# Patient Record
Sex: Male | Born: 1980
Health system: Southern US, Community
[De-identification: ages and names within clinical notes are randomized; demographics above are authoritative.]

## PROBLEM LIST (undated history)

## (undated) DIAGNOSIS — G44009 Cluster headache syndrome, unspecified, not intractable: Secondary | ICD-10-CM

## (undated) DIAGNOSIS — F17200 Nicotine dependence, unspecified, uncomplicated: Secondary | ICD-10-CM

## (undated) DIAGNOSIS — I10 Essential (primary) hypertension: Secondary | ICD-10-CM

## (undated) HISTORY — DX: Nicotine dependence, unspecified, uncomplicated: F17.200

## (undated) HISTORY — PX: SPINE SURGERY: SHX786

---
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
## 2000-11-24 ENCOUNTER — Emergency Department (HOSPITAL_COMMUNITY): Admission: EM | Admit: 2000-11-24 | Discharge: 2000-11-24 | Payer: Self-pay | Admitting: Emergency Medicine

## 2001-12-17 ENCOUNTER — Emergency Department (HOSPITAL_COMMUNITY): Admission: EM | Admit: 2001-12-17 | Discharge: 2001-12-17 | Payer: Self-pay | Admitting: Internal Medicine

## 2001-12-24 HISTORY — PX: OTHER SURGICAL HISTORY: SHX169

## 2002-05-28 ENCOUNTER — Inpatient Hospital Stay (HOSPITAL_COMMUNITY): Admission: RE | Admit: 2002-05-28 | Discharge: 2002-05-30 | Payer: Self-pay | Admitting: Orthopaedic Surgery

## 2002-05-28 ENCOUNTER — Emergency Department (HOSPITAL_COMMUNITY): Admission: AC | Admit: 2002-05-28 | Discharge: 2002-05-28 | Payer: Self-pay

## 2002-05-28 ENCOUNTER — Encounter: Payer: Self-pay | Admitting: Emergency Medicine

## 2004-02-02 ENCOUNTER — Emergency Department (HOSPITAL_COMMUNITY): Admission: EM | Admit: 2004-02-02 | Discharge: 2004-02-02 | Payer: Self-pay | Admitting: Emergency Medicine

## 2011-07-05 ENCOUNTER — Other Ambulatory Visit: Payer: Self-pay | Admitting: Family Medicine

## 2011-07-05 DIAGNOSIS — R51 Headache: Secondary | ICD-10-CM

## 2011-07-09 ENCOUNTER — Ambulatory Visit
Admission: RE | Admit: 2011-07-09 | Discharge: 2011-07-09 | Disposition: A | Discharge status: Home / Self Care | Payer: PRIVATE HEALTH INSURANCE | Source: Ambulatory Visit | Attending: Family Medicine | Admitting: Family Medicine

## 2011-07-09 DIAGNOSIS — R51 Headache: Secondary | ICD-10-CM

## 2011-07-09 MED ORDER — IOHEXOL 300 MG/ML  SOLN: 75.0000 mL | Freq: Once | INTRAMUSCULAR | Status: AC | PRN

## 2017-01-31 ENCOUNTER — Encounter (HOSPITAL_COMMUNITY): Payer: Self-pay

## 2017-01-31 ENCOUNTER — Emergency Department (HOSPITAL_COMMUNITY)
Admission: EM | Admit: 2017-01-31 | Discharge: 2017-02-01 | Disposition: A | Discharge status: Home / Self Care | Payer: PRIVATE HEALTH INSURANCE | Attending: Emergency Medicine | Admitting: Emergency Medicine

## 2017-01-31 ENCOUNTER — Emergency Department (HOSPITAL_COMMUNITY): Payer: PRIVATE HEALTH INSURANCE

## 2017-01-31 DIAGNOSIS — I10 Essential (primary) hypertension: Secondary | ICD-10-CM | POA: Insufficient documentation

## 2017-01-31 DIAGNOSIS — Y939 Activity, unspecified: Secondary | ICD-10-CM | POA: Insufficient documentation

## 2017-01-31 DIAGNOSIS — Y9289 Other specified places as the place of occurrence of the external cause: Secondary | ICD-10-CM | POA: Insufficient documentation

## 2017-01-31 DIAGNOSIS — Z79899 Other long term (current) drug therapy: Secondary | ICD-10-CM | POA: Insufficient documentation

## 2017-01-31 DIAGNOSIS — W208XXA Other cause of strike by thrown, projected or falling object, initial encounter: Secondary | ICD-10-CM | POA: Diagnosis not present

## 2017-01-31 DIAGNOSIS — S61412A Laceration without foreign body of left hand, initial encounter: Secondary | ICD-10-CM | POA: Diagnosis present

## 2017-01-31 DIAGNOSIS — Y999 Unspecified external cause status: Secondary | ICD-10-CM | POA: Insufficient documentation

## 2017-01-31 DIAGNOSIS — S161XXA Strain of muscle, fascia and tendon at neck level, initial encounter: Secondary | ICD-10-CM | POA: Diagnosis not present

## 2017-01-31 DIAGNOSIS — F17200 Nicotine dependence, unspecified, uncomplicated: Secondary | ICD-10-CM | POA: Diagnosis not present

## 2017-01-31 HISTORY — DX: Essential (primary) hypertension: I10

## 2017-01-31 MED ORDER — LIDOCAINE HCL (PF) 1 % IJ SOLN
5.0000 mL | Freq: Once | INTRAMUSCULAR | Status: AC
  Administered 2017-01-31: 5 mL
  Filled 2017-01-31: qty 5

## 2017-01-31 MED ORDER — TETANUS-DIPHTH-ACELL PERTUSSIS 5-2.5-18.5 LF-MCG/0.5 IM SUSP
0.5000 mL | Freq: Once | INTRAMUSCULAR | Status: AC
  Administered 2017-01-31: 0.5 mL via INTRAMUSCULAR
  Filled 2017-01-31: qty 0.5

## 2017-01-31 MED ORDER — ONDANSETRON 4 MG PO TBDP
4.0000 mg | ORAL_TABLET | Freq: Once | ORAL | Status: AC
  Administered 2017-01-31: 4 mg via ORAL
  Filled 2017-01-31: qty 1

## 2017-01-31 NOTE — ED Triage Notes (Signed)
Pt complaining of L hand lac. Pt with no bleeding at triage. Pt with full ROM, full sensation. Pt with no complaints. Cap refill < 3s.

## 2017-01-31 NOTE — ED Provider Notes (Signed)
[facility]-EMERGENCY DEPT Provider Note   CSN: [id number] Arrival date & time: 01/31/17  1924   By signing my name below, I, Clarisse Gouge, attest that this documentation has been prepared under the direction and in the presence of [facility], FNP. Electronically Signed: Clarisse Gouge, Scribe. 01/31/17. 9:57 PM.   History   Chief Complaint Chief Complaint  Patient presents with  . Extremity Laceration   The history is provided by the patient, medical records and a significant other. No language interpreter was used.    HPI Comments: Gabriel Petty is a 36 y.o. male who presents to the Emergency Department complaining of arthralgias following an accident earlier today. He states a freezer fell on his shoulders and neck. Pt states he sustained a laceration to the left hand, and he currently c/o back, shoulder and neck pain. He further reports no treatments tried at home. Pt denies head trauma or LOC. Last tetanus unknown.  Past Medical History:  Diagnosis Date  . Hypertension     There are no active problems to display for this patient.   History reviewed. No pertinent surgical history.     Home Medications    Prior to Admission medications   Medication Sig Start Date End Date Taking? Authorizing Provider  diclofenac (VOLTAREN) 50 MG EC tablet Take 1 tablet (50 mg total) by mouth 2 (two) times daily. 02/01/17   Fahd Galea Orlene Och, NP  HYDROcodone-acetaminophen (NORCO) 5-325 MG tablet Take 1 tablet by mouth every 6 (six) hours as needed. 02/01/17   Deamonte Sayegh Orlene Och, NP  methocarbamol (ROBAXIN) 500 MG tablet Take 1 tablet (500 mg total) by mouth 2 (two) times daily. 02/01/17   Huong Luthi Orlene Och, NP    Family History History reviewed. No pertinent family history.  Social History Social History  Substance Use Topics  . Smoking status: Current Every Day Smoker  . Smokeless tobacco: Never Used  . Alcohol use No     Allergies   Patient has no allergy information on record.   Review of  Systems Review of Systems  HENT: Negative for dental problem.   Respiratory: Negative for shortness of breath.   Gastrointestinal: Positive for nausea. Negative for abdominal pain and vomiting.  Musculoskeletal: Positive for arthralgias, back pain and neck pain.  Skin: Positive for wound.  Neurological: Negative for syncope, weakness, numbness and headaches.  Psychiatric/Behavioral: Negative for confusion.     Physical Exam Updated Vital Signs BP (!) 150/103   Pulse 105   Temp 98.2 F (36.8 C) (Oral)   Resp 16   SpO2 96%   Physical Exam  Constitutional: He is oriented to person, place, and time. Vital signs are normal. He appears well-developed and well-nourished.  Non-toxic appearance. No distress.  Afebrile, nontoxic, NAD  HENT:  Head: Normocephalic and atraumatic.  Mouth/Throat: Uvula is midline and mucous membranes are normal. Normal dentition.  No dental injuries  Eyes: Conjunctivae and EOM are normal. Pupils are equal, round, and reactive to light. Right eye exhibits no discharge. Left eye exhibits no discharge.  Neck: Trachea normal. Neck supple. Spinous process tenderness and muscular tenderness present. Decreased range of motion: due to pain.  Cardiovascular: Regular rhythm.  Tachycardia present.   Pulmonary/Chest: Effort normal. No respiratory distress.  Abdominal: Normal appearance.  Musculoskeletal: Normal range of motion.       Cervical back: He exhibits tenderness and spasm. He exhibits normal pulse.  Neurological: He is alert and oriented to person, place, and time. He has normal strength. No  sensory deficit.  Skin: Skin is warm, dry and intact. No rash noted.  2 cm laceration to the radial side palmar aspect of the left thumb.  Psychiatric: He has a normal mood and affect.  Nursing note and vitals reviewed.    ED Treatments / Results  DIAGNOSTIC STUDIES: Oxygen Saturation is 96% on RA, adequate by my interpretation.    COORDINATION OF CARE: 9:54 PM  Discussed treatment plan with pt at bedside and pt agreed to plan. Will order imaging and medicati  Radiology Dg Cervical Spine Complete  Result Date: 01/31/2017 CLINICAL DATA:  Pain when freezer fell on patient's neck and shoulders. EXAM: CERVICAL SPINE - COMPLETE 4+ VIEW COMPARISON:  None. FINDINGS: There is no evidence of cervical spine fracture nor bone destruction. The atlantodental interval and craniocervical relationship appears intact. There is slight reversal cervical lordosis with apex at C3-4 which may be due to muscle spasm or positioning. Neural foramina are not well visualized due to the obliquity of the images acquired. IMPRESSION: No acute cervical spine fracture. Slight reversal of upper cervical lordosis may be due to muscle spasm or positioning. Electronically Signed   By: Tollie Eth M.D.   On: 01/31/2017 22:24    Procedures .Marland KitchenLaceration Repair Date/Time: 01/31/2017 10:30 PM Performed by: Janne Napoleon Authorized by: Janne Napoleon   Consent:    Consent obtained:  Verbal   Consent given by:  Patient   Risks discussed:  Infection   Alternatives discussed:  No treatment Anesthesia (see MAR for exact dosages):    Anesthesia method:  Local infiltration   Local anesthetic:  Lidocaine 1% w/o epi Laceration details:    Location:  Finger   Finger location:  L thumb   Length (cm):  2 Repair type:    Repair type:  Simple Pre-procedure details:    Preparation:  Patient was prepped and draped in usual sterile fashion and imaging obtained to evaluate for foreign bodies Exploration:    Hemostasis achieved with:  Direct pressure   Wound exploration: wound explored through full range of motion and entire depth of wound probed and visualized     Wound extent: no tendon damage noted     Contaminated: no   Treatment:    Area cleansed with:  Betadine   Irrigation solution:  Sterile saline   Irrigation method:  Syringe Skin repair:    Repair method:  Sutures   Suture size:  4-0    Suture material:  Prolene   Suture technique:  Simple interrupted   Number of sutures:  4 Approximation:    Approximation:  Close   Vermilion border: well-aligned   Post-procedure details:    Dressing:  Sterile dressing   Patient tolerance of procedure:  Tolerated well, no immediate complications   (including critical care time)  Medications Ordered in ED Medications  Tdap (BOOSTRIX) injection 0.5 mL (0.5 mLs Intramuscular Given 01/31/17 2237)  lidocaine (PF) (XYLOCAINE) 1 % injection 5 mL (5 mLs Infiltration Given 01/31/17 2237)  ondansetron (ZOFRAN-ODT) disintegrating tablet 4 mg (4 mg Oral Given 01/31/17 2236)  cyclobenzaprine (FLEXERIL) tablet 10 mg (10 mg Oral Given 02/01/17 0019)  HYDROcodone-acetaminophen (NORCO/VICODIN) 5-325 MG per tablet 1 tablet (1 tablet Oral Given 02/01/17 0019)     Initial Impression / Assessment and Plan / ED Course  I have reviewed the triage vital signs and the nursing notes.  Pertinent imaging results that were available during my care of the patient were reviewed by me and considered in my medical  decision making (see chart for details).      Tetanus updated in ED.Marland Kitchen. Laceration occurred < 12 hours prior to repair. Discussed laceration care with pt and answered questions. Pt to f-u for suture removal in 7 days and wound check sooner should there be signs of dehiscence or infection. Pt is hemodynamically stable with no complaints prior to dc.    Will treat for muscle spasm of the neck, return precautions.  I personally performed the services described in this documentation, which was scribed in my presence. The recorded information has been reviewed and is accurate.   Final Clinical Impressions(s) / ED Diagnoses   Final diagnoses:  Laceration of left hand without foreign body, initial encounter  Cervical strain, acute, initial encounter    New Prescriptions Discharge Medication List as of 02/01/2017 12:51 AM    START taking these medications   Details   diclofenac (VOLTAREN) 50 MG EC tablet Take 1 tablet (50 mg total) by mouth 2 (two) times daily., Starting Fri 02/01/2017, Print    HYDROcodone-acetaminophen (NORCO) 5-325 MG tablet Take 1 tablet by mouth every 6 (six) hours as needed., Starting Fri 02/01/2017, Print    methocarbamol (ROBAXIN) 500 MG tablet Take 1 tablet (500 mg total) by mouth 2 (two) times daily., Starting Fri 02/01/2017, [address]Print         Kaelin Holford Franklin ParkM Lamarcus Spira, NP 02/02/17 [id number]    Shaune Pollackameron Isaacs, MD 02/03/17 1904    Shaune Pollackameron Isaacs, MD 02/03/17 [phone]

## 2017-02-01 MED ORDER — DICLOFENAC SODIUM 50 MG PO TBEC: 50.0000 mg | DELAYED_RELEASE_TABLET | Freq: Two times a day (BID) | ORAL | 0 refills | Status: DC

## 2017-02-01 MED ORDER — METHOCARBAMOL 500 MG PO TABS: 500.0000 mg | ORAL_TABLET | Freq: Two times a day (BID) | ORAL | 0 refills | Status: DC

## 2017-02-01 MED ORDER — HYDROCODONE-ACETAMINOPHEN 5-325 MG PO TABS
1.0000 | ORAL_TABLET | Freq: Once | ORAL | Status: AC
  Administered 2017-02-01: 1 via ORAL
  Filled 2017-02-01: qty 1

## 2017-02-01 MED ORDER — CYCLOBENZAPRINE HCL 10 MG PO TABS
10.0000 mg | ORAL_TABLET | Freq: Once | ORAL | Status: AC
  Administered 2017-02-01: 10 mg via ORAL
  Filled 2017-02-01: qty 1

## 2017-02-01 MED ORDER — HYDROCODONE-ACETAMINOPHEN 5-325 MG PO TABS: 1.0000 | ORAL_TABLET | Freq: Four times a day (QID) | ORAL | 0 refills | Status: DC | PRN

## 2017-02-01 NOTE — Discharge Instructions (Signed)
Do not drive if taking the narcotic or muscle relaxant because they will make you sleepy. Follow up for suture removal in 7 days. Return here sooner for any problems.

## 2017-07-10 ENCOUNTER — Encounter: Payer: Self-pay | Admitting: Neurology

## 2017-10-21 ENCOUNTER — Encounter: Payer: Self-pay | Admitting: Neurology

## 2017-10-21 ENCOUNTER — Ambulatory Visit (INDEPENDENT_AMBULATORY_CARE_PROVIDER_SITE_OTHER): Payer: PRIVATE HEALTH INSURANCE | Admitting: Neurology

## 2017-10-21 VITALS — BP 120/88 | HR 80 | Ht 68.0 in | Wt 174.0 lb

## 2017-10-21 DIAGNOSIS — G43709 Chronic migraine without aura, not intractable, without status migrainosus: Secondary | ICD-10-CM | POA: Diagnosis not present

## 2017-10-21 DIAGNOSIS — Z72820 Sleep deprivation: Secondary | ICD-10-CM | POA: Diagnosis not present

## 2017-10-21 DIAGNOSIS — F17200 Nicotine dependence, unspecified, uncomplicated: Secondary | ICD-10-CM | POA: Diagnosis not present

## 2017-10-21 MED ORDER — ZOLMITRIPTAN 5 MG NA SOLN: 5.0000 mg | NASAL | 0 refills | Status: DC | PRN

## 2017-10-21 MED ORDER — NORTRIPTYLINE HCL 10 MG PO CAPS: 10.0000 mg | ORAL_CAPSULE | Freq: Every day | ORAL | 3 refills | Status: DC

## 2017-10-21 NOTE — Progress Notes (Signed)
NEUROLOGY CONSULTATION NOTE  Gabriel StackJermane Hashimi MRN: [id number] DOB: [birth year]  Referring provider: Ronney LionMarian Hazy, FNP Primary care provider: Windle GuardWilson Elkins, MD  Reason for consult:  migraines  HISTORY OF PRESENT ILLNESS: Gabriel Petty is a 36 year old male with history of pseudotumor cerebri who presents for headache.   Onset:  Since childhood Location:  Bilateral, starts back of head and radiates to front Quality:  pounding Intensity:  severe Aura:  no Prodrome:  no Postdrome:  no Associated symptoms:  Photophobia, phonophobia.  No nausea, vomiting or visual disturbance.  He has not had any new worse headache of his life, waking up from sleep Duration:  8 to 10 hours with Maxalt, otherwise several days Frequency:  16 headache days a month Frequency of abortive medication: infrequent Triggers/exacerbating factors:  Loud noise, lights Relieving factors:  no Activity:  aggravates  Past NSAIDS:  Ibuprofen 800mg , naproxen, diclofenac Past analgesics:  Excedrin, Tylenol #3 Past abortive triptans:  Sumatriptan tablet Past muscle relaxants:  Robaxin Past anti-emetic:  no Past antihypertensive medications:  no Past antidepressant medications:  no Past anticonvulsant medications:  topiramate (paresthesias) Past vitamins/Herbal/Supplements:  no Other past therapies:  Trigger point injections  Current NSAIDS:  no Current analgesics:  no Current triptans:  Maxalt MLT 10mg  Current anti-emetic:  no Current muscle relaxants:  no Current anti-anxiolytic:  no Current sleep aide:  no Current Antihypertensive medications:  no Current Antidepressant medications:  no Current Anticonvulsant medications:  no Current Vitamins/Herbal/Supplements:  no Current Antihistamines/Decongestants:  Zyrtec Other therapy:  no  Caffeine:  Less than 4 oz coffee daily Alcohol:  no Smoker:  yes Diet:  Hydrates with water.  No soda. Exercise:  Punching bag Depression/anxiety:  no Sleep hygiene:  Poor.   Cannot fall asleep.  Grinds teeth in sleep.  Wife reports pauses in breathing in his sleep. Family history of headache:  Mom  CT of head with and without contrast from 07/09/11 was personally reviewed and was normal.  PAST MEDICAL HISTORY: Past Medical History:  Diagnosis Date  . Hypertension     PAST SURGICAL HISTORY: History reviewed. No pertinent surgical history.  MEDICATIONS: Current Outpatient Prescriptions on File Prior to Visit  Medication Sig Dispense Refill  . diclofenac (VOLTAREN) 50 MG EC tablet Take 1 tablet (50 mg total) by mouth 2 (two) times daily. 15 tablet 0  . HYDROcodone-acetaminophen (NORCO) 5-325 MG tablet Take 1 tablet by mouth every 6 (six) hours as needed. 8 tablet 0  . methocarbamol (ROBAXIN) 500 MG tablet Take 1 tablet (500 mg total) by mouth 2 (two) times daily. 20 tablet 0   No current facility-administered medications on file prior to visit.     ALLERGIES: Not on File  FAMILY HISTORY: Family History  Problem Relation Age of Onset  . Diabetes Maternal Grandmother   . Thyroid disease Maternal Grandfather     SOCIAL HISTORY: Social History   Social History  . Marital status: Married    Spouse name: N/A  . Number of children: 4  . Years of education: N/A   Occupational History  . Not on file.   Social History Main Topics  . Smoking status: Current Every Day Smoker    Packs/day: 0.50  . Smokeless tobacco: Never Used  . Alcohol use No  . Drug use: Yes    Types: Marijuana  . Sexual activity: Not on file   Other Topics Concern  . Not on file   Social History Narrative   Married, has 4 children. Works  as a Product manager at [employer]. Drinks 4 oz of coffee a day.     REVIEW OF SYSTEMS: Constitutional: No fevers, chills, or sweats, no generalized fatigue, change in appetite Eyes: No visual changes, double vision, eye pain Ear, nose and throat: No hearing loss, ear pain, nasal congestion, sore  throat Cardiovascular: No chest pain, palpitations Respiratory:  No shortness of breath at rest or with exertion, wheezes GastrointestinaI: No nausea, vomiting, diarrhea, abdominal pain, fecal incontinence Genitourinary:  No dysuria, urinary retention or frequency Musculoskeletal:  No neck pain, back pain Integumentary: No rash, pruritus, skin lesions Neurological: as above Psychiatric: No depression, insomnia, anxiety Endocrine: No palpitations, fatigue, diaphoresis, mood swings, change in appetite, change in weight, increased thirst Hematologic/Lymphatic:  No purpura, petechiae. Allergic/Immunologic: no itchy/runny eyes, nasal congestion, recent allergic reactions, rashes  PHYSICAL EXAM: Vitals:   10/21/17 1501  BP: 120/88  Pulse: 80  SpO2: 98%   General: No acute distress.  Patient appears well-groomed.  Head:  Normocephalic/atraumatic Eyes:  fundi examined but not visualized Neck: supple, no paraspinal tenderness, full range of motion Back: No paraspinal tenderness Heart: regular rate and rhythm Lungs: Clear to auscultation bilaterally. Vascular: No carotid bruits. Neurological Exam: Mental status: alert and oriented to person, place, and time, recent and remote memory intact, fund of knowledge intact, attention and concentration intact, speech fluent and not dysarthric, language intact. Cranial nerves: CN I: not tested CN II: pupils equal, round and reactive to light, visual fields intact CN III, IV, VI:  full range of motion, no nystagmus, no ptosis CN V: facial sensation intact CN VII: upper and lower face symmetric CN VIII: hearing intact CN IX, X: gag intact, uvula midline CN XI: sternocleidomastoid and trapezius muscles intact CN XII: tongue midline Bulk & Tone: normal, no fasciculations. Motor:  5/5 throughout  Sensation: temperature and vibration sensation intact. Deep Tendon Reflexes:  2+ throughout, toes downgoing.  Finger to nose testing:  Without dysmetria.   Heel to shin:  Without dysmetria.  Gait:  Normal station and stride.  Able to turn and tandem walk. Romberg negative.  IMPRESSION: Chronic migraine Poor sleep Cigarette smoker  PLAN: 1.  Start nortriptyline 10mg  at bedtime.  If not improved in 4 weeks, he is to contact us and I will increase dose to 25mg  at bedtime 2.  Try Zomig 5mg  NS for abortive therapy 3.  Sleep hygiene recommendations provided.  Send for sleep study to rule out OSA 4.  Smoking cessation instruction/counseling given:  counseled patient on the dangers of tobacco use, advised patient to stop smoking, and reviewed strategies to maximize success. 5.  Follow up in 3 months.  Thank you for allowing me to take part in the care of this patient.  Shon Millet, DO  CC:  Windle Guard, MD  Ronney Lion, FNP

## 2017-10-21 NOTE — Patient Instructions (Signed)
Migraine Recommendations: 1.  Start nortriptyline 10mg  at bedtime.  Call in 4 weeks with update and we can adjust dose if needed. 2.  Take Zomig, 1 spray in nostril at earliest onset of headache.  May repeat dose once in 2 hours if needed.  Do not exceed two sprays in 24 hours. 3.  Limit use of pain relievers to no more than 2 days out of the week.  These medications include acetaminophen, ibuprofen, triptans and narcotics.  This will help reduce risk of rebound headaches. 4.  Be aware of common food triggers such as processed sweets, processed foods with nitrites (such as deli meat, hot dogs, sausages), foods with MSG, alcohol (such as wine), chocolate, certain cheeses, certain fruits (dried fruits, bananas, some citrus fruit), vinegar, diet soda. 4.  Avoid caffeine 5.  Routine exercise 6.  Proper sleep hygiene.  Will order sleep study. 7.  Stay adequately hydrated with water 8.  Keep a headache diary. 9.  Maintain proper stress management. 10.  Do not skip meals. 11.  Consider supplements:  Magnesium citrate 400mg  to 600mg  daily, riboflavin 400mg , Coenzyme Q 10 100mg  three times daily 12.  Follow up in 3 months.

## 2018-02-10 ENCOUNTER — Ambulatory Visit: Payer: PRIVATE HEALTH INSURANCE | Admitting: Neurology

## 2018-11-23 ENCOUNTER — Encounter (HOSPITAL_COMMUNITY): Payer: Self-pay | Admitting: Emergency Medicine

## 2018-11-23 ENCOUNTER — Emergency Department (HOSPITAL_COMMUNITY): Payer: PRIVATE HEALTH INSURANCE

## 2018-11-23 ENCOUNTER — Other Ambulatory Visit: Payer: Self-pay

## 2018-11-23 ENCOUNTER — Emergency Department (HOSPITAL_COMMUNITY)
Admission: EM | Admit: 2018-11-23 | Discharge: 2018-11-23 | Disposition: A | Discharge status: Home / Self Care | Payer: PRIVATE HEALTH INSURANCE | Attending: Emergency Medicine | Admitting: Emergency Medicine

## 2018-11-23 DIAGNOSIS — Z79899 Other long term (current) drug therapy: Secondary | ICD-10-CM | POA: Diagnosis not present

## 2018-11-23 DIAGNOSIS — R059 Cough, unspecified: Secondary | ICD-10-CM

## 2018-11-23 DIAGNOSIS — I10 Essential (primary) hypertension: Secondary | ICD-10-CM | POA: Diagnosis not present

## 2018-11-23 DIAGNOSIS — F17200 Nicotine dependence, unspecified, uncomplicated: Secondary | ICD-10-CM | POA: Insufficient documentation

## 2018-11-23 DIAGNOSIS — R05 Cough: Secondary | ICD-10-CM | POA: Diagnosis present

## 2018-11-23 LAB — COMPREHENSIVE METABOLIC PANEL
ALBUMIN: 4.5 g/dL (ref 3.5–5.0)
ALK PHOS: 59 U/L (ref 38–126)
ALT: 16 U/L (ref 0–44)
AST: 21 U/L (ref 15–41)
Anion gap: 10 (ref 5–15)
BILIRUBIN TOTAL: 1 mg/dL (ref 0.3–1.2)
BUN: 8 mg/dL (ref 6–20)
CO2: 26 mmol/L (ref 22–32)
Calcium: 9.5 mg/dL (ref 8.9–10.3)
Chloride: 101 mmol/L (ref 98–111)
Creatinine, Ser: 1.08 mg/dL (ref 0.61–1.24)
GFR calc Af Amer: >60 mL/min (ref >60)
GFR calc non Af Amer: >60 mL/min (ref >60)
Glucose, Bld: 89 mg/dL (ref 70–99)
POTASSIUM: 4 mmol/L (ref 3.5–5.1)
Sodium: 137 mmol/L (ref 135–145)
TOTAL PROTEIN: 7.9 g/dL (ref 6.5–8.1)

## 2018-11-23 LAB — RAPID HIV SCREEN (HIV 1/2 AB+AG)
HIV 1/2 Antibodies: NONREACTIVE
HIV-1 P24 Antigen - HIV24: NONREACTIVE

## 2018-11-23 LAB — CBC WITH DIFFERENTIAL/PLATELET
Abs Immature Granulocytes: 0.02 10*3/uL (ref 0.00–0.07)
Basophils Absolute: 0 10*3/uL (ref 0.0–0.1)
Basophils Relative: 0 %
Eosinophils Absolute: 0.1 10*3/uL (ref 0.0–0.5)
Eosinophils Relative: 1 %
HEMATOCRIT: 53.5 % — AB (ref 39.0–52.0)
HEMOGLOBIN: 17.3 g/dL — AB (ref 13.0–17.0)
IMMATURE GRANULOCYTES: 0 %
LYMPHS ABS: 2.7 10*3/uL (ref 0.7–4.0)
LYMPHS PCT: 29 %
MCH: 29.9 pg (ref 26.0–34.0)
MCHC: 32.3 g/dL (ref 30.0–36.0)
MCV: 92.6 fL (ref 80.0–100.0)
MONOS PCT: 7 %
Monocytes Absolute: 0.6 10*3/uL (ref 0.1–1.0)
NEUTROS PCT: 63 %
Neutro Abs: 5.7 10*3/uL (ref 1.7–7.7)
PLATELETS: 304 10*3/uL (ref 150–400)
RBC: 5.78 MIL/uL (ref 4.22–5.81)
RDW: 12.8 % (ref 11.5–15.5)
WBC: 9.1 10*3/uL (ref 4.0–10.5)
nRBC: 0 % (ref 0.0–0.2)

## 2018-11-23 LAB — D-DIMER, QUANTITATIVE: D-Dimer, Quant: <0.27 ug/mL-FEU (ref 0.00–0.50)

## 2018-11-23 MED ORDER — AZITHROMYCIN 250 MG PO TABS: 250.0000 mg | ORAL_TABLET | Freq: Every day | ORAL | 0 refills | Status: DC

## 2018-11-23 NOTE — Discharge Instructions (Signed)
It was my pleasure taking care of you today!   Please take all of your antibiotics until finished!  Follow up with your primary care doctor if your symptoms do not improve in another week or so.   Return to ER for new or worsening symptoms, any additional concerns.

## 2018-11-23 NOTE — ED Notes (Signed)
Patient able to ambulate independently  

## 2018-11-23 NOTE — ED Notes (Signed)
Ambulated pt about 50 feet, oxygen stayed between 95 and 99%, only dropping once to 93% about half way. Heart rate dropped to 43 at/or about the the same time pulse ox dropped

## 2018-11-23 NOTE — ED Triage Notes (Addendum)
Pt here for eval of 3 weeks of coughing with bloody expectorant/rust colored mucous. States he went to [place] in sept[place]. Pt feels short of breath throughout the day with activity. Pt states he takes tylenol PM at night along with allergy medications at night, every night. No acute distress noted.

## 2018-11-23 NOTE — ED Provider Notes (Signed)
[facility] EMERGENCY DEPARTMENT Provider Note   CSN: [id number] Arrival date & time: 11/23/18  1417     History   Chief Complaint Chief Complaint  Patient presents with  . Hemoptysis    HPI Xylan Sheils is a 37 y.o. male.  The history is provided by the patient and medical records. No language interpreter was used.   Arby Dahir is a 37 y.o. male  with a PMH of HTN who presents to the Emergency Department complaining of persistent daily cough for the last 3 weeks. He reports that when wakes up in the morning, mucus appears dark red and he is concerned that it may be blood. He drinks a lot of water throughout the day and feels like that is why the mucus lightens up. Throughout the day, sputum will turn from the dark red color to a lighter red brown color which he describes as "rust" colored. He does feel short of breath at times, mostly with activity. He is a daily smoker. He flew to [place] and then went on a cruise back in September, but no recent travel since. He denies any fever, chills. Has been taking Tylenol PM and OTC allergy medication without relief. No nights sweats or weight loss. No known sick contacts. No leg pain or swelling. No chest pain. Minimal nasal congestion.   Past Medical History:  Diagnosis Date  . Hypertension     There are no active problems to display for this patient.   History reviewed. No pertinent surgical history.      Home Medications    Prior to Admission medications   Medication Sig Start Date End Date Taking? Authorizing Provider  cetirizine (ZYRTEC) 10 MG tablet Take 10 mg by mouth daily.   Yes [provider]  diphenhydramine-acetaminophen (TYLENOL PM) 25-500 MG TABS tablet Take 1 tablet by mouth at bedtime as needed.   Yes [provider]  ibuprofen (ADVIL,MOTRIN) 200 MG tablet Take 200 mg by mouth every 6 (six) hours as needed for headache.   Yes [provider]  azithromycin (ZITHROMAX)  250 MG tablet Take 1 tablet (250 mg total) by mouth daily. Take first 2 tablets together, then 1 every day until finished. 11/23/18   Gavin Telford, Chase Picket, PA-C  diclofenac (VOLTAREN) 50 MG EC tablet Take 1 tablet (50 mg total) by mouth 2 (two) times daily. Patient not taking: Reported on 11/23/2018 02/01/17   Janne Napoleon, NP  HYDROcodone-acetaminophen (NORCO) 5-325 MG tablet Take 1 tablet by mouth every 6 (six) hours as needed. Patient not taking: Reported on 11/23/2018 02/01/17   Janne Napoleon, NP  methocarbamol (ROBAXIN) 500 MG tablet Take 1 tablet (500 mg total) by mouth 2 (two) times daily. Patient not taking: Reported on 11/23/2018 02/01/17   Janne Napoleon, NP  nortriptyline (PAMELOR) 10 MG capsule Take 1 capsule (10 mg total) by mouth at bedtime. Patient not taking: Reported on 11/23/2018 10/21/17   Drema Dallas, DO  zolmitriptan (ZOMIG) 5 MG nasal solution Place 1 spray into the nose as needed for migraine. Patient not taking: Reported on 11/23/2018 10/21/17   Drema Dallas, DO    Family History Family History  Problem Relation Age of Onset  . Diabetes Maternal Grandmother   . Thyroid disease Maternal Grandfather     Social History Social History   Tobacco Use  . Smoking status: Current Every Day Smoker    Packs/day: 0.50  . Smokeless tobacco: Never Used  Substance Use Topics  .  Alcohol use: No  . Drug use: Yes    Types: Marijuana     Allergies   Patient has no known allergies.   Review of Systems Review of Systems  Constitutional: Negative for fever.  HENT: Positive for congestion. Negative for sore throat.   Respiratory: Positive for cough and shortness of breath.   All other systems reviewed and are negative.    Physical Exam Updated Vital Signs BP (!) 139/100 (BP Location: Left Arm)   Pulse 79   Temp 98.6 F (37 C)   Resp 18   SpO2 100%   Physical Exam  Constitutional: He is oriented to person, place, and time. He appears well-developed and well-nourished.  No distress.  HENT:  Head: Normocephalic and atraumatic.  OP with mild erythema. No exudates or tonsillar hypertrophy.  Cardiovascular: Normal rate, regular rhythm and normal heart sounds.  No murmur heard. Pulmonary/Chest: Effort normal and breath sounds normal. No respiratory distress.  Lungs clear to auscultation bilaterally.  Abdominal: Soft. He exhibits no distension. There is no tenderness.  Musculoskeletal: He exhibits no edema.  Neurological: He is alert and oriented to person, place, and time.  Skin: Skin is warm and dry.  Nursing note and vitals reviewed.    ED Treatments / Results  Labs (all labs ordered are listed, but only abnormal results are displayed) Labs Reviewed  CBC WITH DIFFERENTIAL/PLATELET - Abnormal; Notable for the following components:      Result Value   Hemoglobin 17.3 (*)    HCT 53.5 (*)    All other components within normal limits  D-DIMER, QUANTITATIVE (NOT AT [facility])  COMPREHENSIVE METABOLIC PANEL  RAPID HIV SCREEN (HIV 1/2 AB+AG)    EKG None  Radiology Dg Chest 2 View  Result Date: 11/23/2018 CLINICAL DATA:  Productive cough 3 weeks with some shortness-of-breath. EXAM: CHEST - 2 VIEW COMPARISON:  None. FINDINGS: Lungs are adequately inflated without focal airspace consolidation or effusion. Cardiomediastinal silhouette, bones and soft tissues are within normal. IMPRESSION: No active cardiopulmonary disease. Electronically Signed   By: Elberta Fortis M.D.   On: 11/23/2018 16:05    Procedures Procedures (including critical care time)  Medications Ordered in ED Medications - No data to display   Initial Impression / Assessment and Plan / ED Course  I have reviewed the triage vital signs and the nursing notes.  Pertinent labs & imaging results that were available during my care of the patient were reviewed by me and considered in my medical decision making (see chart for details).    Anakin Varkey is a 37 y.o. male who presents to ED for  persistent cough for the last 3 weeks with sputum production he described as "rust" colored and is concerned that this might actually be hemoptysis.  He denies any fever and has very little nasal congestion.  No other true URI symptoms.  Chest x-ray without acute findings.  D-dimer negative.  Remainder of lab work reassuring. . Evaluation does not show pathology that would require ongoing emergent intervention or inpatient treatment. Given length of symptoms, will treat with z-pack. PCP follow up if symptoms persist strongly encouraged. Reasons to return to ER discussed and all questions answered.   Patient discussed with Dr. Anitra Lauth who agrees with treatment plan.    Final Clinical Impressions(s) / ED Diagnoses   Final diagnoses:  Cough    ED Discharge Orders         Ordered    azithromycin (ZITHROMAX) 250 MG tablet  Daily  11/23/18 1658           Kiaraliz Rafuse, Chase PicketJaime Pilcher, PA-C 11/23/18 1714    Gwyneth SproutPlunkett, [place], MD 11/23/18 2059

## 2019-11-02 ENCOUNTER — Ambulatory Visit
Admission: EM | Admit: 2019-11-02 | Discharge: 2019-11-02 | Disposition: A | Discharge status: Home / Self Care | Payer: BC Managed Care – PPO | Attending: Physician Assistant | Admitting: Physician Assistant

## 2019-11-02 ENCOUNTER — Other Ambulatory Visit: Payer: Self-pay

## 2019-11-02 DIAGNOSIS — I10 Essential (primary) hypertension: Secondary | ICD-10-CM

## 2019-11-02 DIAGNOSIS — Z20828 Contact with and (suspected) exposure to other viral communicable diseases: Secondary | ICD-10-CM | POA: Diagnosis not present

## 2019-11-02 DIAGNOSIS — R519 Headache, unspecified: Secondary | ICD-10-CM | POA: Diagnosis not present

## 2019-11-02 HISTORY — DX: Cluster headache syndrome, unspecified, not intractable: G44.009

## 2019-11-02 MED ORDER — METOCLOPRAMIDE HCL 5 MG/ML IJ SOLN
5.0000 mg | Freq: Once | INTRAMUSCULAR | Status: AC
  Administered 2019-11-02: 15:00:00 5 mg via INTRAMUSCULAR

## 2019-11-02 MED ORDER — DEXAMETHASONE SODIUM PHOSPHATE 10 MG/ML IJ SOLN
10.0000 mg | Freq: Once | INTRAMUSCULAR | Status: AC
  Administered 2019-11-02: 14:00:00 10 mg via INTRAMUSCULAR

## 2019-11-02 MED ORDER — KETOROLAC TROMETHAMINE 30 MG/ML IJ SOLN
30.0000 mg | Freq: Once | INTRAMUSCULAR | Status: AC
  Administered 2019-11-02: 14:00:00 30 mg via INTRAMUSCULAR

## 2019-11-02 NOTE — ED Triage Notes (Signed)
Pt c/o headache x3wks. States hx of cluster headaches but this is the longest.

## 2019-11-02 NOTE — Discharge Instructions (Signed)
Reglan, Toradol, Decadron injection in office today.  Also had course of oxygen to help with cluster headache.  At this time, continue to monitor symptoms.  Covid testing ordered.  Please quarantine until testing results return.  If developing shortness of breath, go to the emergency department for further evaluation.  Otherwise, follow-up with PCP for further evaluation of cluster headaches.

## 2019-11-02 NOTE — ED Provider Notes (Signed)
[facility]    CSN: [id number] Arrival date & time: 11/02/19  1300      History   Chief Complaint Chief Complaint  Patient presents with  . Headache    HPI Gabriel Petty is a 38 y.o. male.   38 year old male comes in for 3 week history of headaches. Headache to the crown of the head, constant, throbbing in sensation. He can be photophobic, phonophobic. Fragrant/strong smells can increase pain. Denies vision changes, nausea, vomiting. Denies URI symptoms such as cough, congestion, sore throat. Denies chills, body aches. Tmax 100.4. Has had loose stools. Denies abdominal pain. Denies shortness of breath, loss of taste/smell. Current every day smoker, has had decreased amounts. Denies sick/COVID contact.   Patient states has history of cluster headaches, and symptoms are similar.  However, usually cluster headaches resolve in about 14 days.  Given continued symptoms, therefore came in for evaluation.      Past Medical History:  Diagnosis Date  . Headaches, cluster   . Hypertension     There are no active problems to display for this patient.   History reviewed. No pertinent surgical history.     Home Medications    Prior to Admission medications   Medication Sig Start Date End Date Taking? Authorizing Provider  cetirizine (ZYRTEC) 10 MG tablet Take 10 mg by mouth daily.    [provider]  diclofenac (VOLTAREN) 50 MG EC tablet Take 1 tablet (50 mg total) by mouth 2 (two) times daily. Patient not taking: Reported on 11/23/2018 02/01/17   Janne Napoleon, NP  diphenhydramine-acetaminophen (TYLENOL PM) 25-500 MG TABS tablet Take 1 tablet by mouth at bedtime as needed.    [provider]  ibuprofen (ADVIL,MOTRIN) 200 MG tablet Take 200 mg by mouth every 6 (six) hours as needed for headache.    [provider]  nortriptyline (PAMELOR) 10 MG capsule Take 1 capsule (10 mg total) by mouth at bedtime. Patient not taking: Reported on 11/23/2018  10/21/17   Drema Dallas, DO    Family History Family History  Problem Relation Age of Onset  . Diabetes Maternal Grandmother   . Thyroid disease Maternal Grandfather     Social History Social History   Tobacco Use  . Smoking status: Current Every Day Smoker    Packs/day: 0.50  . Smokeless tobacco: Never Used  Substance Use Topics  . Alcohol use: No  . Drug use: Yes    Types: Marijuana     Allergies   Patient has no known allergies.   Review of Systems Review of Systems  Reason unable to perform ROS: See HPI as above.    Physical Exam Triage Vital Signs ED Triage Vitals  Enc Vitals Group     BP 11/02/19 1311 (!) 187/105     Pulse Rate 11/02/19 1311 83     Resp 11/02/19 1311 18     Temp 11/02/19 1311 98.4 F (36.9 C)     Temp Source 11/02/19 1311 Oral     SpO2 11/02/19 1311 97 %     Weight --      Height --      Head Circumference --      Peak Flow --      Pain Score 11/02/19 1312 7     Pain Loc --      Pain Edu? --      Excl. in [place]? --    No data found.  Updated Vital Signs BP Marland Kitchen)  187/105 (BP Location: Right Arm)   Pulse 83   Temp 98.4 F (36.9 C) (Oral)   Resp 18   SpO2 97%   Physical Exam Constitutional:      General: He is not in acute distress.    Appearance: Normal appearance. He is not ill-appearing, toxic-appearing or diaphoretic.  HENT:     Head: Normocephalic and atraumatic.     Mouth/Throat:     Mouth: Mucous membranes are moist.     Pharynx: Oropharynx is clear. Uvula midline.  Eyes:     Extraocular Movements: Extraocular movements intact.     Conjunctiva/sclera: Conjunctivae normal.     Pupils: Pupils are equal, round, and reactive to light.     Comments: No photophobia on exam.  Neck:     Musculoskeletal: Normal range of motion and neck supple.  Cardiovascular:     Rate and Rhythm: Normal rate and regular rhythm.     Heart sounds: Normal heart sounds. No murmur. No friction rub. No gallop.   Pulmonary:     Effort:  Pulmonary effort is normal. No accessory muscle usage, prolonged expiration, respiratory distress or retractions.     Comments: Lungs clear to auscultation without adventitious lung sounds. Neurological:     General: No focal deficit present.     Mental Status: He is alert and oriented to person, place, and time.     GCS: GCS eye subscore is 4. GCS verbal subscore is 5. GCS motor subscore is 6.     Coordination: Coordination is intact.     Gait: Gait is intact.      UC Treatments / Results  Labs (all labs ordered are listed, but only abnormal results are displayed) Labs Reviewed  NOVEL CORONAVIRUS, NAA    EKG   Radiology No results found.  Procedures Procedures (including critical care time)  Medications Ordered in UC Medications  metoCLOPramide (REGLAN) injection 5 mg (5 mg Intramuscular Given 11/02/19 1433)  dexamethasone (DECADRON) injection 10 mg (10 mg Intramuscular Given 11/02/19 1345)  ketorolac (TORADOL) 30 MG/ML injection 30 mg (30 mg Intramuscular Given 11/02/19 1345)    Initial Impression / Assessment and Plan / UC Course  I have reviewed the triage vital signs and the nursing notes.  Pertinent labs & imaging results that were available during my care of the patient were reviewed by me and considered in my medical decision making (see chart for details).    No alarming signs on exam.  Will swab for Covid given continued headache though without any URI symptoms at this time.  Patient to quarantine until testing results return.  Toradol, Reglan, Decadron injection was given in office.  Given history of cluster headache, also put patient on oxygen.  Patient with good relief of symptoms after treatment.  To continue to monitor symptoms.  Discharged in stable condition pending Covid results.  Return precautions given.  Otherwise patient to follow-up with PCP for further management of cluster headaches.  Patient expresses understanding and agrees to plan.  Final  Clinical Impressions(s) / UC Diagnoses   Final diagnoses:  Acute intractable headache, unspecified headache type   ED Prescriptions    None     PDMP not reviewed this encounter.   Ok Edwards, PA-C 11/02/19 1645

## 2019-11-03 LAB — NOVEL CORONAVIRUS, NAA: SARS-CoV-2, NAA: NOT DETECTED

## 2019-11-04 DIAGNOSIS — R030 Elevated blood-pressure reading, without diagnosis of hypertension: Secondary | ICD-10-CM | POA: Diagnosis not present

## 2020-02-16 ENCOUNTER — Other Ambulatory Visit: Payer: Self-pay

## 2020-02-16 ENCOUNTER — Ambulatory Visit
Admission: EM | Admit: 2020-02-16 | Discharge: 2020-02-16 | Disposition: A | Discharge status: Home / Self Care | Payer: BC Managed Care – PPO | Attending: Emergency Medicine | Admitting: Emergency Medicine

## 2020-02-16 ENCOUNTER — Encounter: Payer: Self-pay | Admitting: Emergency Medicine

## 2020-02-16 DIAGNOSIS — I10 Essential (primary) hypertension: Secondary | ICD-10-CM

## 2020-02-16 DIAGNOSIS — R519 Headache, unspecified: Secondary | ICD-10-CM | POA: Diagnosis not present

## 2020-02-16 DIAGNOSIS — R509 Fever, unspecified: Secondary | ICD-10-CM

## 2020-02-16 DIAGNOSIS — Z20822 Contact with and (suspected) exposure to covid-19: Secondary | ICD-10-CM | POA: Diagnosis not present

## 2020-02-16 MED ORDER — ACETAMINOPHEN 325 MG PO TABS
650.0000 mg | ORAL_TABLET | Freq: Once | ORAL | Status: AC
  Administered 2020-02-16: 650 mg via ORAL

## 2020-02-16 NOTE — ED Provider Notes (Signed)
[facility]    CSN: [id number] Arrival date & time: 02/16/20  1701      History   Chief Complaint Chief Complaint  Patient presents with  . URI    HPI Gabriel Petty is a 39 y.o. male with history of hypertension, cluster headaches presenting for 3-day course of loss of taste.  Also endorsing some chills with intermittent headache.  States headache was associate with nausea on Saturday: No emesis.  Has had difficulty sleeping as well due to symptoms.  No fever, cough, nasal congestion, sore throat, difficulty breathing or swallowing, abdominal pain, diarrhea.  Currently noting headache that is present: Mild to moderate.  Denies worst headache of life, visual or auditory changes, nausea, vomiting, numbness or weakness.    Past Medical History:  Diagnosis Date  . Headaches, cluster   . Hypertension     There are no problems to display for this patient.   History reviewed. No pertinent surgical history.     Home Medications    Prior to Admission medications   Medication Sig Start Date End Date Taking? Authorizing Provider  ibuprofen (ADVIL,MOTRIN) 200 MG tablet Take 200 mg by mouth every 6 (six) hours as needed for headache.    [provider]  nortriptyline (PAMELOR) 10 MG capsule Take 1 capsule (10 mg total) by mouth at bedtime. Patient not taking: Reported on 11/23/2018 10/21/17   Pieter Partridge, DO  cetirizine (ZYRTEC) 10 MG tablet Take 10 mg by mouth daily.  02/16/20  [provider]  diphenhydramine-acetaminophen (TYLENOL PM) 25-500 MG TABS tablet Take 1 tablet by mouth at bedtime as needed.  02/16/20  [provider]    Family History Family History  Problem Relation Age of Onset  . Diabetes Maternal Grandmother   . Thyroid disease Maternal Grandfather     Social History Social History   Tobacco Use  . Smoking status: Current Every Day Smoker    Packs/day: 0.50  . Smokeless tobacco: Never Used  Substance Use Topics  .  Alcohol use: No  . Drug use: Yes    Types: Marijuana     Allergies   Patient has no known allergies.   Review of Systems As per HPI   Physical Exam Triage Vital Signs ED Triage Vitals  Enc Vitals Group     BP 02/16/20 1724 (!) 149/100     Pulse Rate 02/16/20 1724 88     Resp 02/16/20 1724 18     Temp 02/16/20 1724 (!) 100.9 F (38.3 C)     Temp Source 02/16/20 1724 Temporal     SpO2 02/16/20 1724 96 %     Weight --      Height --      Head Circumference --      Peak Flow --      Pain Score 02/16/20 1725 8     Pain Loc --      Pain Edu? --      Excl. in [place]? --    No data found.  Updated Vital Signs BP (!) 149/100 (BP Location: Left Arm)   Pulse 88   Temp 100.1 F (37.8 C) (Oral) Comment: Taken by APP during assessment  Resp 18   SpO2 96%   Visual Acuity Right Eye Distance:   Left Eye Distance:   Bilateral Distance:    Right Eye Near:   Left Eye Near:    Bilateral Near:     Physical Exam Constitutional:  General: He is not in acute distress.    Appearance: He is ill-appearing. He is not toxic-appearing or diaphoretic.  HENT:     Head: Normocephalic and atraumatic.     Right Ear: Tympanic membrane, ear canal and external ear normal.     Left Ear: Tympanic membrane, ear canal and external ear normal.     Mouth/Throat:     Mouth: Mucous membranes are moist.     Pharynx: Oropharynx is clear.  Eyes:     General: No scleral icterus.    Conjunctiva/sclera: Conjunctivae normal.     Pupils: Pupils are equal, round, and reactive to light.  Cardiovascular:     Rate and Rhythm: Normal rate.  Pulmonary:     Effort: Pulmonary effort is normal. No respiratory distress.     Breath sounds: No wheezing.  Musculoskeletal:     Cervical back: Neck supple. No tenderness.  Lymphadenopathy:     Cervical: No cervical adenopathy.  Skin:    General: Skin is warm.     Capillary Refill: Capillary refill takes less than 2 seconds.     Coloration: Skin is not  jaundiced or pale.  Neurological:     General: No focal deficit present.     Mental Status: He is alert and oriented to person, place, and time.     Cranial Nerves: No cranial nerve deficit.     Gait: Gait normal.     Deep Tendon Reflexes: Reflexes normal.      UC Treatments / Results  Labs (all labs ordered are listed, but only abnormal results are displayed) Labs Reviewed  NOVEL CORONAVIRUS, NAA - Abnormal; Notable for the following components:      Result Value   SARS-CoV-2, NAA Detected (*)    All other components within normal limits   Narrative:    Performed at:  [address], [place], [place]  [id number] Lab Director: Jolene Schimke MD, Phone:  [phone]    EKG   Radiology No results found.  Procedures Procedures (including critical care time)  Medications Ordered in UC Medications  acetaminophen (TYLENOL) tablet 650 mg (650 mg Oral Given 02/16/20 1733)    Initial Impression / Assessment and Plan / UC Course  I have reviewed the triage vital signs and the nursing notes.  Pertinent labs & imaging results that were available during my care of the patient were reviewed by me and considered in my medical decision making (see chart for details).     Patient febrile, though nontoxic and without acute distress.  Patient given Tylenol in office with temperature trending down at time of discharge.  Offered headache cocktail: Patient declined stating that he would rather go home and "wait it out".  Covid test pending: Patient to quarantine at home until results are back, treat supportively in the interim.  Return precautions discussed, patient verbalized understanding and is agreeable to plan. Final Clinical Impressions(s) / UC Diagnoses   Final diagnoses:  Fever, unspecified  Frontal headache     Discharge Instructions     Your COVID test is pending - it is important to quarantine / isolate at home until your results are back. If you  test positive and would like further evaluation for persistent or worsening symptoms, you may schedule an E-visit or virtual (video) visit throughout the [facility] app or website.  PLEASE NOTE: If you develop severe chest pain or shortness of breath please go to the ER or call [email or web address] for further evaluation -->  DO NOT schedule electronic or virtual visits for this. Please call our office for further guidance / recommendations as needed.  For information about the Covid vaccine, please visit [email or web address]    ED Prescriptions    None     PDMP not reviewed this encounter.   Hall[place], [place], [place] 02/19/20 1433

## 2020-02-16 NOTE — Discharge Instructions (Addendum)
Your COVID test is pending - it is important to quarantine / isolate at home until your results are back. °If you test positive and would like further evaluation for persistent or worsening symptoms, you may schedule an E-visit or virtual (video) visit throughout the [facility] MyChart app or website. ° °PLEASE NOTE: If you develop severe chest pain or shortness of breath please go to the ER or call 9-1-1 for further evaluation --> DO NOT schedule electronic or virtual visits for this. °Please call our office for further guidance / recommendations as needed. ° °For information about the Covid vaccine, please visit [email or web address] °

## 2020-02-16 NOTE — ED Triage Notes (Signed)
Pt presents to [facility] for assessment of 3 days of loss of taste ("everything tastes like bleach"), chills.  Patient states that he had a very sharp headache which put him in bed Saturday night with nausea, lasting approx 40 minutes.  Headache at this time as well.  States he has not been able to sleep since Sunday night.  Pt c/o sweats at night.  Denies cough, denies nasal congestion, denies sore throat, denies abdominal pain, denies diarrhea.

## 2020-02-18 LAB — NOVEL CORONAVIRUS, NAA: SARS-CoV-2, NAA: DETECTED — AB

## 2020-02-19 ENCOUNTER — Telehealth (HOSPITAL_COMMUNITY): Payer: Self-pay | Admitting: Emergency Medicine

## 2020-02-19 NOTE — Telephone Encounter (Signed)

## 2020-02-24 ENCOUNTER — Ambulatory Visit
Admission: EM | Admit: 2020-02-24 | Discharge: 2020-02-24 | Disposition: A | Discharge status: Home / Self Care | Payer: BC Managed Care – PPO

## 2020-02-24 DIAGNOSIS — Z8616 Personal history of COVID-19: Secondary | ICD-10-CM

## 2020-02-24 DIAGNOSIS — G44019 Episodic cluster headache, not intractable: Secondary | ICD-10-CM

## 2020-02-24 DIAGNOSIS — I10 Essential (primary) hypertension: Secondary | ICD-10-CM

## 2020-02-24 NOTE — ED Provider Notes (Signed)
[facility]    CSN: [id number] Arrival date & time: 02/24/20  1611      History   Chief Complaint Chief Complaint  Patient presents with   Headache    HPI Gabriel Petty is a 39 y.o. male with h/o HTN, cluster headaches presenting for persistent cluster headache.  Tested positive for Covid 10 days ago: Overall feels his URI symptoms are resolving, though headache is persisting.  States it feels consistent with previous clusters: Has tried numerous medications OTC without significant relief.  Denies change in vision or hearing, dizziness, numbness or tingling, chest pain, difficulty breathing.  Denies worst headache of life, thunderclap headache, head trauma or LOC.   Past Medical History:  Diagnosis Date   Headaches, cluster    Hypertension     There are no problems to display for this patient.   History reviewed. No pertinent surgical history.     Home Medications    Prior to Admission medications   Medication Sig Start Date End Date Taking? Authorizing Provider  ibuprofen (ADVIL,MOTRIN) 200 MG tablet Take 200 mg by mouth every 6 (six) hours as needed for headache.    [provider]  cetirizine (ZYRTEC) 10 MG tablet Take 10 mg by mouth daily.  02/16/20  [provider]  diphenhydramine-acetaminophen (TYLENOL PM) 25-500 MG TABS tablet Take 1 tablet by mouth at bedtime as needed.  02/16/20  [provider]    Family History Family History  Problem Relation Age of Onset   Diabetes Maternal Grandmother    Thyroid disease Maternal Grandfather     Social History Social History   Tobacco Use   Smoking status: Current Every Day Smoker    Packs/day: 0.50   Smokeless tobacco: Never Used  Substance Use Topics   Alcohol use: No   Drug use: Yes    Types: Marijuana     Allergies   Patient has no known allergies.   Review of Systems As per HPI   Physical Exam Triage Vital Signs ED Triage Vitals  Enc Vitals  Group     BP 02/24/20 1618 (!) 150/95     Pulse Rate 02/24/20 1618 67     Resp 02/24/20 1618 16     Temp 02/24/20 1618 98.4 F (36.9 C)     Temp Source 02/24/20 1618 Oral     SpO2 02/24/20 1618 98 %     Weight --      Height --      Head Circumference --      Peak Flow --      Pain Score 02/24/20 1625 10     Pain Loc --      Pain Edu? --      Excl. in [place]? --    No data found.  Updated Vital Signs BP (!) 150/95 (BP Location: Left Arm)    Pulse 67    Temp 98.4 F (36.9 C) (Oral)    Resp 16    SpO2 98%   Visual Acuity Right Eye Distance:   Left Eye Distance:   Bilateral Distance:    Right Eye Near:   Left Eye Near:    Bilateral Near:     Physical Exam Constitutional:      General: He is not in acute distress.    Appearance: He is well-developed. He is not ill-appearing.  HENT:     Head: Normocephalic and atraumatic.     Right Ear: Tympanic membrane, ear canal and external ear normal.  Left Ear: Tympanic membrane, ear canal and external ear normal.     Mouth/Throat:     Mouth: Mucous membranes are moist.     Pharynx: Oropharynx is clear.  Eyes:     General: No scleral icterus.    Extraocular Movements: Extraocular movements intact.     Right eye: No nystagmus.     Left eye: No nystagmus.     Conjunctiva/sclera: Conjunctivae normal.     Pupils: Pupils are equal, round, and reactive to light.  Cardiovascular:     Rate and Rhythm: Normal rate and regular rhythm.  Pulmonary:     Effort: Pulmonary effort is normal. No respiratory distress.     Breath sounds: No wheezing.  Musculoskeletal:        General: No deformity. Normal range of motion.     Cervical back: Normal range of motion. No rigidity or tenderness.  Lymphadenopathy:     Cervical: No cervical adenopathy.  Skin:    Capillary Refill: Capillary refill takes less than 2 seconds.     Coloration: Skin is not jaundiced.     Findings: No bruising or rash.  Neurological:     Mental Status: He is alert and  oriented to person, place, and time.     GCS: GCS eye subscore is 4. GCS verbal subscore is 5. GCS motor subscore is 6.     Cranial Nerves: Cranial nerves are intact.     Sensory: Sensation is intact.     Motor: Motor function is intact.     Coordination: Coordination is intact.     Gait: Gait is intact.  Psychiatric:        Mood and Affect: Mood normal.        Behavior: Behavior normal.      UC Treatments / Results  Labs (all labs ordered are listed, but only abnormal results are displayed) Labs Reviewed - No data to display  EKG   Radiology No results found.  Procedures Procedures (including critical care time)  Medications Ordered in UC Medications - No data to display  Initial Impression / Assessment and Plan / UC Course  I have reviewed the triage vital signs and the nursing notes.  Pertinent labs & imaging results that were available during my care of the patient were reviewed by me and considered in my medical decision making (see chart for details).     Patient afebrile, nontoxic in office.  No neurocognitive deficit on exam.  H&P consistent with previous episodic cluster headaches that has been intractable at home.  Patient given 15 L O2 via nonrebreather for 15 minutes as per quadratic protocol.  Patient reporting significant improvement in headache at time of discharge.  Patient to follow-up with PCP in 1 to 2 weeks: Keep headache log in the interim.  ER return precautions discussed, patient verbalized understanding and is agreeable to plan. Final Clinical Impressions(s) / UC Diagnoses   Final diagnoses:  Episodic cluster headache, not intractable     Discharge Instructions     Keep log on pending symptoms. Go to ER for recurrence of headache, worst headache of life, visual or auditory changes, chest pain or difficulty breathing.    ED Prescriptions    None     PDMP not reviewed this encounter.   Hall-Potvin, [place], [place] 02/25/20 1505

## 2020-02-24 NOTE — ED Triage Notes (Signed)
Pt c/o headache for 3wks, taking OTC meds with no relief. States tested COVID positive 10 days ago. States his headache woke him up from sleep last night.

## 2020-02-24 NOTE — Discharge Instructions (Signed)
Keep log on pending symptoms. Go to ER for recurrence of headache, worst headache of life, visual or auditory changes, chest pain or difficulty breathing.

## 2020-02-25 ENCOUNTER — Encounter: Payer: Self-pay | Admitting: Emergency Medicine

## 2020-02-25 DIAGNOSIS — Z8616 Personal history of COVID-19: Secondary | ICD-10-CM | POA: Diagnosis not present

## 2020-02-26 ENCOUNTER — Encounter (HOSPITAL_COMMUNITY): Payer: Self-pay | Admitting: Emergency Medicine

## 2020-02-26 ENCOUNTER — Emergency Department (HOSPITAL_COMMUNITY)
Admission: EM | Admit: 2020-02-26 | Discharge: 2020-02-26 | Disposition: A | Discharge status: Home / Self Care | Payer: BC Managed Care – PPO | Attending: Emergency Medicine | Admitting: Emergency Medicine

## 2020-02-26 ENCOUNTER — Other Ambulatory Visit: Payer: Self-pay

## 2020-02-26 DIAGNOSIS — R519 Headache, unspecified: Secondary | ICD-10-CM | POA: Insufficient documentation

## 2020-02-26 DIAGNOSIS — I10 Essential (primary) hypertension: Secondary | ICD-10-CM | POA: Diagnosis not present

## 2020-02-26 DIAGNOSIS — Z79899 Other long term (current) drug therapy: Secondary | ICD-10-CM | POA: Diagnosis not present

## 2020-02-26 DIAGNOSIS — F17210 Nicotine dependence, cigarettes, uncomplicated: Secondary | ICD-10-CM | POA: Diagnosis not present

## 2020-02-26 LAB — CBC
HCT: 51.8 % (ref 39.0–52.0)
Hemoglobin: 17.7 g/dL — ABNORMAL HIGH (ref 13.0–17.0)
MCH: 31.1 pg (ref 26.0–34.0)
MCHC: 34.2 g/dL (ref 30.0–36.0)
MCV: 91 fL (ref 80.0–100.0)
Platelets: 314 10*3/uL (ref 150–400)
RBC: 5.69 MIL/uL (ref 4.22–5.81)
RDW: 12.3 % (ref 11.5–15.5)
WBC: 8.6 10*3/uL (ref 4.0–10.5)
nRBC: 0 % (ref 0.0–0.2)

## 2020-02-26 LAB — BASIC METABOLIC PANEL
Anion gap: 11 (ref 5–15)
BUN: 12 mg/dL (ref 6–20)
CO2: 27 mmol/L (ref 22–32)
Calcium: 9.5 mg/dL (ref 8.9–10.3)
Chloride: 101 mmol/L (ref 98–111)
Creatinine, Ser: 1.18 mg/dL (ref 0.61–1.24)
GFR calc Af Amer: >60 mL/min (ref >60)
GFR calc non Af Amer: >60 mL/min (ref >60)
Glucose, Bld: 98 mg/dL (ref 70–99)
Potassium: 3.7 mmol/L (ref 3.5–5.1)
Sodium: 139 mmol/L (ref 135–145)

## 2020-02-26 MED ORDER — AMLODIPINE BESYLATE 5 MG PO TABS: 5.0000 mg | ORAL_TABLET | Freq: Every day | ORAL | 0 refills | Status: DC

## 2020-02-26 MED ORDER — ACETAMINOPHEN 500 MG PO TABS
1000.0000 mg | ORAL_TABLET | Freq: Once | ORAL | Status: AC
  Administered 2020-02-26: 1000 mg via ORAL
  Filled 2020-02-26: qty 2

## 2020-02-26 MED ORDER — AMLODIPINE BESYLATE 5 MG PO TABS
5.0000 mg | ORAL_TABLET | Freq: Once | ORAL | Status: AC
  Administered 2020-02-26: 5 mg via ORAL
  Filled 2020-02-26: qty 1

## 2020-02-26 NOTE — Discharge Instructions (Addendum)
It was our pleasure to provide your ER care today - we hope that you feel better.  Take blood pressure medication as prescribed. Also, to help your health and blood pressure, limit salt intake, see heart healthy meal plan, avoid smoking, and avoid heavy caffeine use.   Follow up with primary care doctor in the coming week.  Return to ER if worse, new symptoms, fevers, new or severe pain, chest pain, trouble breathing, or other concern.

## 2020-02-26 NOTE — ED Notes (Signed)
Patient verbalizes understanding of discharge instructions. Opportunity for questioning and answers were provided. Armband removed by staff, pt discharged from ED ambulatory.   

## 2020-02-26 NOTE — ED Triage Notes (Signed)
Pt endorses HTN and headaches for 7 weeks. States he has been monitoring his BP at home and has been 180s/100s. Pt not on BP meds. Covid negative as of yesterday.

## 2020-02-26 NOTE — ED Provider Notes (Signed)
[facility] EMERGENCY DEPARTMENT Provider Note   CSN: [id number] Arrival date & time: 02/26/20  1432     History Chief Complaint  Patient presents with  . Hypertension    Gabriel Petty is a 39 y.o. male.  Patient with hx high blood pressure readings, presents with concern for elevated blood pressure. States at home systolic bp as high as 190/. Denies previously being treated with bp meds, but says bp high in past, and there is also family hx htn. Notes intermittent, mild, frontal headaches - dull, non radiating, gradual onset, not severe, c/w prior headaches, not every day, no specific exacerbating or alleviating factors, no change by position or time of day. Denies eye pain or change in vision. No neck pain or stiffness. No associated numbness or weakness. No problems w balance or coordination or with normal functional ability.  Denies sinus drainage or pain. No fever or chills. No recent head trauma. No syncope. Normal appetite. No wt loss. Denies any cocaine or stimulant use, no excessive caffeine or energy drink use.   The history is provided by the patient.  Hypertension Pertinent negatives include no chest pain, no abdominal pain, no headaches and no shortness of breath.       Past Medical History:  Diagnosis Date  . Headaches, cluster   . Hypertension     There are no problems to display for this patient.   History reviewed. No pertinent surgical history.     Family History  Problem Relation Age of Onset  . Diabetes Maternal Grandmother   . Thyroid disease Maternal Grandfather     Social History   Tobacco Use  . Smoking status: Current Every Day Smoker    Packs/day: 0.50  . Smokeless tobacco: Never Used  Substance Use Topics  . Alcohol use: No  . Drug use: Yes    Types: Marijuana    Home Medications Prior to Admission medications   Medication Sig Start Date End Date Taking? Authorizing Provider  ibuprofen (ADVIL,MOTRIN) 200 MG tablet  Take 200 mg by mouth every 6 (six) hours as needed for headache.    [provider]  cetirizine (ZYRTEC) 10 MG tablet Take 10 mg by mouth daily.  02/16/20  [provider]  diphenhydramine-acetaminophen (TYLENOL PM) 25-500 MG TABS tablet Take 1 tablet by mouth at bedtime as needed.  02/16/20  [provider]    Allergies    Patient has no known allergies.  Review of Systems   Review of Systems  Constitutional: Negative for fever.  HENT: Negative for sinus pain and sore throat.   Eyes: Negative for pain, redness and visual disturbance.  Respiratory: Negative for shortness of breath.   Cardiovascular: Negative for chest pain.  Gastrointestinal: Negative for abdominal pain and vomiting.  Genitourinary: Negative for flank pain.  Musculoskeletal: Negative for neck pain and neck stiffness.  Skin: Negative for rash.  Neurological: Negative for speech difficulty, weakness, numbness and headaches.  Hematological: Does not bruise/bleed easily.  Psychiatric/Behavioral: Negative for confusion.    Physical Exam Updated Vital Signs BP (!) 155/107   Pulse 64   Temp 97.7 F (36.5 C) (Oral)   Resp 11   Ht 1.702 m (5\' 7" )   Wt 82.6 kg   SpO2 100%   BMI 28.51 kg/m   Physical Exam Vitals and nursing note reviewed.  Constitutional:      Appearance: Normal appearance. He is well-developed.  HENT:     Head: Atraumatic.     Comments:  No sinus or temporal tenderness. No facial sts or redness.     Nose: Nose normal. No rhinorrhea.     Mouth/Throat:     Mouth: Mucous membranes are moist.     Pharynx: Oropharynx is clear.  Eyes:     General: No scleral icterus.    Extraocular Movements: Extraocular movements intact.     Conjunctiva/sclera: Conjunctivae normal.     Pupils: Pupils are equal, round, and reactive to light.  Neck:     Trachea: No tracheal deviation.     Comments: Thyroid not grossly enlarged or tender.  Cardiovascular:     Rate and Rhythm: Normal rate  and regular rhythm.     Pulses: Normal pulses.     Heart sounds: Normal heart sounds. No murmur. No friction rub. No gallop.   Pulmonary:     Effort: Pulmonary effort is normal. No accessory muscle usage or respiratory distress.     Breath sounds: Normal breath sounds.  Abdominal:     General: Bowel sounds are normal. There is no distension.     Palpations: Abdomen is soft.     Tenderness: There is no abdominal tenderness.     Comments: No bruits.   Genitourinary:    Comments: No cva tenderness. Musculoskeletal:        General: No swelling.     Cervical back: Normal range of motion and neck supple. No rigidity.     Right lower leg: No edema.     Left lower leg: No edema.  Skin:    General: Skin is warm and dry.     Findings: No rash.  Neurological:     Mental Status: He is alert.     Comments: Alert, speech clear/normal. Motor intact bil, stre 5/5. sens grossly intact. Steady gait.   Psychiatric:        Mood and Affect: Mood normal.     ED Results / Procedures / Treatments   Labs (all labs ordered are listed, but only abnormal results are displayed) Results for orders placed or performed during the hospital encounter of 02/26/20  CBC  Result Value Ref Range   WBC 8.6 4.0 - 10.5 K/uL   RBC 5.69 4.22 - 5.81 MIL/uL   Hemoglobin 17.7 (H) 13.0 - 17.0 g/dL   HCT 51.8 39.0 - 52.0 %   MCV 91.0 80.0 - 100.0 fL   MCH 31.1 26.0 - 34.0 pg   MCHC 34.2 30.0 - 36.0 g/dL   RDW 12.3 11.5 - 15.5 %   Platelets 314 150 - 400 K/uL   nRBC 0.0 0.0 - 0.2 %  Basic metabolic panel  Result Value Ref Range   Sodium 139 135 - 145 mmol/L   Potassium 3.7 3.5 - 5.1 mmol/L   Chloride 101 98 - 111 mmol/L   CO2 27 22 - 32 mmol/L   Glucose, Bld 98 70 - 99 mg/dL   BUN 12 6 - 20 mg/dL   Creatinine, Ser 1.18 0.61 - 1.24 mg/dL   Calcium 9.5 8.9 - 10.3 mg/dL   GFR calc non Af Amer >60 >60 mL/min   GFR calc Af Amer >60 >60 mL/min   Anion gap 11 5 - 15    EKG None  Radiology No results  found.  Procedures Procedures (including critical care time)  Medications Ordered in ED Medications  amLODipine (NORVASC) tablet 5 mg (has no administration in time range)  acetaminophen (TYLENOL) tablet 1,000 mg (has no administration in time range)    ED  Course  I have reviewed the triage vital signs and the nursing notes.  Pertinent labs & imaging results that were available during my care of the patient were reviewed by me and considered in my medical decision making (see chart for details).    MDM Rules/Calculators/A&P                      Pt indicates has not taken any medication for headache today, even otc meds.   Acetaminophen po. Po fluids.   Reviewed nursing notes and prior charts for additional history.   Labs reviewed/interpreted by me - chem normal, wbc normal, hct normal.   Recheck, pt remains high. On review prior charts, hx elevated blood pressure then as well. Given hx prior elevated bp, fam hx htn, bp elev today, will start bp medication, and rec pcp f/u.  Pt currently appears stable for d/c.   Return precautions provided.     Final Clinical Impression(s) / ED Diagnoses Final diagnoses:  None    Rx / DC Orders ED Discharge Orders    None       Cathren Laine, MD 02/26/20 1651

## 2020-03-11 ENCOUNTER — Ambulatory Visit (INDEPENDENT_AMBULATORY_CARE_PROVIDER_SITE_OTHER): Payer: BC Managed Care – PPO | Admitting: Family Medicine

## 2020-03-11 ENCOUNTER — Encounter: Payer: Self-pay | Admitting: Family Medicine

## 2020-03-11 DIAGNOSIS — G44021 Chronic cluster headache, intractable: Secondary | ICD-10-CM | POA: Diagnosis not present

## 2020-03-11 DIAGNOSIS — I10 Essential (primary) hypertension: Secondary | ICD-10-CM | POA: Diagnosis not present

## 2020-03-11 DIAGNOSIS — R0789 Other chest pain: Secondary | ICD-10-CM

## 2020-03-11 DIAGNOSIS — Z8616 Personal history of COVID-19: Secondary | ICD-10-CM | POA: Diagnosis not present

## 2020-03-11 MED ORDER — BUTALBITAL-APAP-CAFFEINE 50-325-40 MG PO TABS: 1.0000 | ORAL_TABLET | Freq: Four times a day (QID) | ORAL | 2 refills | Status: DC | PRN

## 2020-03-11 MED ORDER — AMLODIPINE BESYLATE 5 MG PO TABS: 5.0000 mg | ORAL_TABLET | Freq: Every day | ORAL | 6 refills | Status: DC

## 2020-03-11 NOTE — Progress Notes (Signed)
DOB and name verified   F /u ED and BP  Taking his BP med at night BP reading today 133/85 P 89 as per pt has BP machine at home  Pt states his higher readings was at 149/90 and the lowest 111/64   C /o headache since yesterday  afternoon pain not relieved with Motrin 800 mg. Pain 4/10 at this time

## 2020-03-11 NOTE — Progress Notes (Signed)
Virtual Visit via Telephone Note  I connected with Gabriel Petty on 03/11/20 at 10:30 AM EDT by telephone and verified that I am speaking with the correct person using two identifiers.  Location: Patient: Home  Provider: Office   I discussed the limitations, risks, security and privacy concerns of performing an evaluation and management service by telephone and the availability of in person appointments. I also discussed with the patient that there may be a patient responsible charge related to this service. The patient expressed understanding and agreed to proceed.   History of Present Illness: Gabriel Petty, 39 y.o. male, presents for hypertension follow-up and HA management.  Cluster HA Chronic on-going long-term problem with cluster-migraine type HA. Previously followed by neurology and felt abortive and preventative therapy caused worsening of symptoms due to side effects of medications. Currently has had a HA x 1 day which has waxed and waned without resolution. He has checked his BP which has improved although remains elevated since starting antihypertensive therapy. Readings have averaged 130's/100 with some readings in 140's/90-100's. He is experiencing SOB and chest tightness although correlates this to his recent COVID-19 infection. Patient endorses fatigue, SOB and chest tightness on going on a daily basis since COVID-19 diagnosis. Energy level remains rather low. He was not hospitalized for infection although was limited to bed for several days following diagnosis. Increased risk for complications related to COVID-19 diagnosis increase daily smoking , hypertension, and weight.  Social History   Socioeconomic History  . Marital status: Married    Spouse name: Not on file  . Number of children: 4  . Years of education: Not on file  . Highest education level: Not on file  Occupational History  . Not on file  Tobacco Use  . Smoking status: Current Every Day Smoker    Packs/day:  0.25  . Smokeless tobacco: Never Used  Substance and Sexual Activity  . Alcohol use: No  . Drug use: Yes    Types: Marijuana  . Sexual activity: Not on file  Other Topics Concern  . Not on file  Social History Narrative   Married, has 4 children. Works as a Product manager at [employer]. Drinks 4 oz of coffee a day.    Social Determinants of Health   Financial Resource Strain:   . Difficulty of Paying Living Expenses:   Food Insecurity:   . Worried About Programme researcher, broadcasting/film/video in the Last Year:   . Barista in the Last Year:   Transportation Needs:   . Freight forwarder (Medical):   Marland Kitchen Lack of Transportation (Non-Medical):   Physical Activity:   . Days of Exercise per Week:   . Minutes of Exercise per Session:   Stress:   . Feeling of Stress :   Social Connections:   . Frequency of Communication with Friends and Family:   . Frequency of Social Gatherings with Friends and Family:   . Attends Religious Services:   . Active Member of Clubs or Organizations:   . Attends Banker Meetings:   Marland Kitchen Marital Status:   Intimate Partner Violence:   . Fear of Current or Ex-Partner:   . Emotionally Abused:   Marland Kitchen Physically Abused:   . Sexually Abused:    Observations/Objective: Patient speaking clearly and appropriately. No distress.   Assessment and Plan: 1. Essential hypertension, stable not at goal -Increase Amlodipine 10 mg once daily  -Goal readings less than 140/90. -DASH diet   2.  History of COVID-19 -Information given to follow-up at the post [place] [facility]   3. Chronic cluster headache, intractable -Trial Fiorcet 1 tablet at onset of HA   4. Other chest pain Suspect COVID-19 related given is mostly occurring with deep breathing Red flags discussed and indication seek a higher level of care discussed.    Meds ordered this encounter  Medications  . amLODipine (NORVASC) 5 MG tablet    Sig: Take 1 tablet (5 mg total) by mouth  daily.    Dispense:  30 tablet    Refill:  6  . butalbital-acetaminophen-caffeine (FIORICET) 50-325-40 MG tablet    Sig: Take 1 tablet by mouth every 6 (six) hours as needed for headache or migraine (Avoid taking more than 5 days consecutively this can cause rebound Headache).    Dispense:  30 tablet    Refill:  2    Follow Up Instructions: Labs in 6-8 weeks and BP check    I discussed the assessment and treatment plan with the patient. The patient was provided an opportunity to ask questions and all were answered. The patient agreed with the plan and demonstrated an understanding of the instructions.   The patient was advised to call back or seek an in-person evaluation if the symptoms worsen or if the condition fails to improve as anticipated.  I provided 20 minutes of non-face-to-face time during this encounter.   Molli Barrows, FNP Primary Care at [facility] [address], [place] [phone]: [phone]

## 2020-03-15 ENCOUNTER — Ambulatory Visit (INDEPENDENT_AMBULATORY_CARE_PROVIDER_SITE_OTHER): Payer: BC Managed Care – PPO | Admitting: Nurse Practitioner

## 2020-03-15 ENCOUNTER — Emergency Department (HOSPITAL_COMMUNITY)
Admission: EM | Admit: 2020-03-15 | Discharge: 2020-03-15 | Disposition: A | Discharge status: Home / Self Care | Payer: BC Managed Care – PPO | Attending: Emergency Medicine | Admitting: Emergency Medicine

## 2020-03-15 ENCOUNTER — Encounter (HOSPITAL_COMMUNITY): Payer: Self-pay

## 2020-03-15 ENCOUNTER — Emergency Department (HOSPITAL_COMMUNITY): Payer: BC Managed Care – PPO

## 2020-03-15 ENCOUNTER — Other Ambulatory Visit: Payer: Self-pay

## 2020-03-15 VITALS — BP 122/84 | HR 75 | Temp 97.8°F | Ht 68.0 in | Wt 181.0 lb

## 2020-03-15 DIAGNOSIS — Z5321 Procedure and treatment not carried out due to patient leaving prior to being seen by health care provider: Secondary | ICD-10-CM | POA: Insufficient documentation

## 2020-03-15 DIAGNOSIS — Z8616 Personal history of COVID-19: Secondary | ICD-10-CM | POA: Diagnosis not present

## 2020-03-15 DIAGNOSIS — J40 Bronchitis, not specified as acute or chronic: Secondary | ICD-10-CM | POA: Diagnosis not present

## 2020-03-15 DIAGNOSIS — R0602 Shortness of breath: Secondary | ICD-10-CM | POA: Diagnosis not present

## 2020-03-15 DIAGNOSIS — R0789 Other chest pain: Secondary | ICD-10-CM

## 2020-03-15 MED ORDER — AZITHROMYCIN 250 MG PO TABS: ORAL_TABLET | ORAL | 0 refills | Status: DC

## 2020-03-15 MED ORDER — PREDNISONE 20 MG PO TABS: 20.0000 mg | ORAL_TABLET | Freq: Every day | ORAL | 0 refills | Status: AC

## 2020-03-15 MED ORDER — SODIUM CHLORIDE 0.9% FLUSH: 3.0000 mL | Freq: Once | INTRAVENOUS | Status: DC

## 2020-03-15 NOTE — Progress Notes (Signed)
@Patient  ID: , male    DOB: 24-Oct-1981, 39 y.o.   MRN: [id number]  Chief Complaint  Patient presents with  . Post COVID    Fatigue and Chest pain to touch and if he is moving around too much. Sometimes has to just sit and catch breath.     Referring provider: [id number], *   39 year old male with history of hypertension and cluster headaches, covid diagnosed in february 2021.  HPI   Patient presents today for a post Covid care visit. He was diagnosed with Covid in February. He was recently seen in the respiratory clinic and treated for ongoing headaches and hypertension. His amlodipine dosage was increased and he was started on Fiorcet for headaches. He states that he has been much improved since last visit. His blood pressure has improved and his headaches are much improved. He does still complain of shortness of breath with exertion and chest wall tenderness around his sternum and to bilateral ribs. Patient admits that he does still smoke daily, but has drastically cut back - only smoking 6 cigarettes per day. Denies f/c/s, n/v/d, hemoptysis, PND, leg swelling, chest pain or edema.       No Known Allergies  Immunization History  Administered Date(s) Administered  . Tdap 01/31/2017    Past Medical History:  Diagnosis Date  . Headaches, cluster   . Hypertension     Tobacco History: Social History   Tobacco Use  Smoking Status Current Every Day Smoker  . Packs/day: 0.25  . Types: Cigarettes  Smokeless Tobacco Never Used   Ready to quit: No Counseling given: Not Answered   Outpatient Encounter Medications as of 03/15/2020  Medication Sig  . amLODipine (NORVASC) 5 MG tablet Take 1 tablet (5 mg total) by mouth daily.  . butalbital-acetaminophen-caffeine (FIORICET) 50-325-40 MG tablet Take 1 tablet by mouth every 6 (six) hours as needed for headache or migraine (Avoid taking more than 5 days consecutively this can cause rebound Headache).  03/17/2020  azithromycin (ZITHROMAX) 250 MG tablet Take 2 tablets (500 mg) on day 1, then take 1 tablet (250 mg) on days 2-5  . ibuprofen (ADVIL,MOTRIN) 200 MG tablet Take 800 mg by mouth every 8 (eight) hours as needed for headache.   . predniSONE (DELTASONE) 20 MG tablet Take 1 tablet (20 mg total) by mouth daily with breakfast for 5 days.  . [DISCONTINUED] cetirizine (ZYRTEC) 10 MG tablet Take 10 mg by mouth daily.  . [DISCONTINUED] diphenhydramine-acetaminophen (TYLENOL PM) 25-500 MG TABS tablet Take 1 tablet by mouth at bedtime as needed.   No facility-administered encounter medications on file as of 03/15/2020.     Review of Systems  Review of Systems  Constitutional: Positive for fatigue. Negative for activity change, appetite change, chills and fever.  HENT: Negative.   Respiratory: Positive for cough and shortness of breath. Negative for wheezing.   Cardiovascular: Negative.   Gastrointestinal: Negative.   Allergic/Immunologic: Negative.   Neurological: Negative.   Psychiatric/Behavioral: Negative.        Physical Exam  BP 122/84 (BP Location: Left Arm, Patient Position: Sitting, Cuff Size: Small)   Pulse 75   Temp 97.8 F (36.6 C)   Ht 5\' 8"  (1.727 m)   Wt 181 lb (82.1 kg)   SpO2 100%   BMI 27.52 kg/m   Wt Readings from Last 5 Encounters:  03/15/20 181 lb (82.1 kg)  02/26/20 182 lb (82.6 kg)  10/21/17 174 lb (78.9 kg)  Physical Exam Vitals and nursing note reviewed.  Constitutional:      General: He is not in acute distress.    Appearance: He is well-developed.  Cardiovascular:     Rate and Rhythm: Normal rate and regular rhythm.  Pulmonary:     Effort: Pulmonary effort is normal.     Breath sounds: Normal breath sounds.     Comments: Left lower lobe diminished Chest:     Comments: Tenderness to palpitation to sternal area and ribs bilat.  Skin:    General: Skin is warm and dry.  Neurological:     Mental Status: He is alert and oriented to person, place,  and time.     Imaging: DG Chest 2 View  Result Date: 03/15/2020 CLINICAL DATA:  Chest pain and shortness of breath since developing COVID 7 weeks ago. EXAM: CHEST - 2 VIEW COMPARISON:  11/23/2018 FINDINGS: The heart size and mediastinal contours are within normal limits. There is slight bilateral peribronchial thickening. There is a tiny area increased density at the left base laterally. The lungs are otherwise clear. The visualized skeletal structures are unremarkable. IMPRESSION: Slight bronchitic changes. Small focal area of atelectasis or infiltrate at the left base laterally. Electronically Signed   By: Francene Boyers M.D.   On: 03/15/2020 16:29     Assessment & Plan:   History of COVID-19 Shortness of breath Chest wall pain/ costochondritis:  Plan: Will check chest x ray and call with results  Will order labs and call with results  Continue low sodium diet  Please quit smoking  Will order prednisone for shortness of breath  Tylenol for pain  Alternate heat and ice packs to chest and ribs     Patient Instructions  Will check chest x ray and call with results  Will order labs and call with results  Continue low sodium diet  Please quit smoking  Will order prednisone for shortness of breath  Tylenol for pain    Costochondritis Costochondritis is swelling and irritation (inflammation) of the tissue (cartilage) that connects your ribs to your breastbone (sternum). This causes pain in the front of your chest. Usually, the pain:  Starts gradually.  Is in more than one rib. This condition usually goes away on its own over time. Follow these instructions at home:  Do not do anything that makes your pain worse.  If directed, put ice on the painful area: ? Put ice in a plastic bag. ? Place a towel between your skin and the bag. ? Leave the ice on for 20 minutes, 2-3 times a day.  If directed, put heat on the affected area as often as told by your doctor.  Use the heat source that your doctor tells you to use, such as a moist heat pack or a heating pad. ? Place a towel between your skin and the heat source. ? Leave the heat on for 20-30 minutes. ? Take off the heat if your skin turns bright red. This is very important if you cannot feel pain, heat, or cold. You may have a greater risk of getting burned.  Take over-the-counter and prescription medicines only as told by your doctor.  Return to your normal activities as told by your doctor. Ask your doctor what activities are safe for you.  Keep all follow-up visits as told by your doctor. This is important. Contact a doctor if:  You have chills or a fever.  Your pain does not go away or it gets worse.  You have a cough that does not go away. Get help right away if:  You are short of breath. This information is not intended to replace advice given to you by your health care provider. Make sure you discuss any questions you have with your health care provider. Document Revised: 12/25/2017 Document Reviewed: 04/04/2016 Elsevier Patient Education  2020 [place], [place] 03/16/2020

## 2020-03-15 NOTE — ED Triage Notes (Signed)
Patient states he has been having intermittent chest pain and SOB since having Covid 7 weeks ago. Patient reports that he was sent to have a chest x-ray and have further evaluation.

## 2020-03-15 NOTE — Patient Instructions (Addendum)
Will check chest x ray and call with results  Will order labs and call with results  Continue low sodium diet  Please quit smoking  Will order prednisone for shortness of breath  Tylenol for pain    Costochondritis Costochondritis is swelling and irritation (inflammation) of the tissue (cartilage) that connects your ribs to your breastbone (sternum). This causes pain in the front of your chest. Usually, the pain:  Starts gradually.  Is in more than one rib. This condition usually goes away on its own over time. Follow these instructions at home:  Do not do anything that makes your pain worse.  If directed, put ice on the painful area: ? Put ice in a plastic bag. ? Place a towel between your skin and the bag. ? Leave the ice on for 20 minutes, 2-3 times a day.  If directed, put heat on the affected area as often as told by your doctor. Use the heat source that your doctor tells you to use, such as a moist heat pack or a heating pad. ? Place a towel between your skin and the heat source. ? Leave the heat on for 20-30 minutes. ? Take off the heat if your skin turns bright red. This is very important if you cannot feel pain, heat, or cold. You may have a greater risk of getting burned.  Take over-the-counter and prescription medicines only as told by your doctor.  Return to your normal activities as told by your doctor. Ask your doctor what activities are safe for you.  Keep all follow-up visits as told by your doctor. This is important. Contact a doctor if:  You have chills or a fever.  Your pain does not go away or it gets worse.  You have a cough that does not go away. Get help right away if:  You are short of breath. This information is not intended to replace advice given to you by your health care provider. Make sure you discuss any questions you have with your health care provider. Document Revised: 12/25/2017 Document Reviewed: 04/04/2016 Elsevier Patient  Education  2020 [employer].

## 2020-03-16 LAB — COMPREHENSIVE METABOLIC PANEL
ALT: 14 IU/L (ref 0–44)
AST: 21 IU/L (ref 0–40)
Albumin/Globulin Ratio: 1.9 (ref 1.2–2.2)
Albumin: 4.4 g/dL (ref 4.0–5.0)
Alkaline Phosphatase: 78 IU/L (ref 39–117)
BUN/Creatinine Ratio: 9 (ref 9–20)
BUN: 9 mg/dL (ref 6–20)
Bilirubin Total: 0.3 mg/dL (ref 0.0–1.2)
CO2: 23 mmol/L (ref 20–29)
Calcium: 9.1 mg/dL (ref 8.7–10.2)
Chloride: 103 mmol/L (ref 96–106)
Creatinine, Ser: 1.05 mg/dL (ref 0.76–1.27)
GFR calc Af Amer: 103 mL/min/{1.73_m2} (ref >59)
GFR calc non Af Amer: 89 mL/min/{1.73_m2} (ref >59)
Globulin, Total: 2.3 g/dL (ref 1.5–4.5)
Glucose: 93 mg/dL (ref 65–99)
Potassium: 4 mmol/L (ref 3.5–5.2)
Sodium: 140 mmol/L (ref 134–144)
Total Protein: 6.7 g/dL (ref 6.0–8.5)

## 2020-03-16 LAB — CBC
Hematocrit: 44.7 % (ref 37.5–51.0)
Hemoglobin: 15.8 g/dL (ref 13.0–17.7)
MCH: 31.7 pg (ref 26.6–33.0)
MCHC: 35.3 g/dL (ref 31.5–35.7)
MCV: 90 fL (ref 79–97)
Platelets: 300 10*3/uL (ref 150–450)
RBC: 4.98 x10E6/uL (ref 4.14–5.80)
RDW: 13 % (ref 11.6–15.4)
WBC: 8 10*3/uL (ref 3.4–10.8)

## 2020-03-16 NOTE — Assessment & Plan Note (Addendum)
Shortness of breath Chest wall pain/ costochondritis:  Plan: Will check chest x ray and call with results  Will order labs and call with results  Continue low sodium diet  Please quit smoking  Will order prednisone for shortness of breath  Tylenol for pain  Alternate heat and ice packs to chest and ribs

## 2020-03-16 NOTE — Progress Notes (Signed)
Patient notified of results, verbally understood. No additional questions.

## 2020-04-11 MED ORDER — SODIUM CHLORIDE (PF) 0.9 % IJ SOLN
INTRAMUSCULAR | Status: AC
  Filled 2020-04-11: qty 50

## 2020-04-18 ENCOUNTER — Ambulatory Visit
Admission: RE | Admit: 2020-04-18 | Discharge: 2020-04-18 | Disposition: A | Discharge status: Home / Self Care | Payer: BC Managed Care – PPO | Source: Ambulatory Visit | Attending: Nurse Practitioner | Admitting: Nurse Practitioner

## 2020-04-18 ENCOUNTER — Ambulatory Visit (INDEPENDENT_AMBULATORY_CARE_PROVIDER_SITE_OTHER): Payer: BC Managed Care – PPO | Admitting: Nurse Practitioner

## 2020-04-18 ENCOUNTER — Other Ambulatory Visit: Payer: Self-pay

## 2020-04-18 VITALS — BP 134/78 | HR 68 | Temp 97.8°F | Ht 68.0 in | Wt 177.8 lb

## 2020-04-18 DIAGNOSIS — Z8616 Personal history of COVID-19: Secondary | ICD-10-CM

## 2020-04-18 DIAGNOSIS — R0789 Other chest pain: Secondary | ICD-10-CM | POA: Diagnosis not present

## 2020-04-18 DIAGNOSIS — R05 Cough: Secondary | ICD-10-CM | POA: Diagnosis not present

## 2020-04-18 DIAGNOSIS — R0602 Shortness of breath: Secondary | ICD-10-CM

## 2020-04-18 DIAGNOSIS — R059 Cough, unspecified: Secondary | ICD-10-CM

## 2020-04-18 MED ORDER — BENZONATATE 100 MG PO CAPS: 100.0000 mg | ORAL_CAPSULE | Freq: Two times a day (BID) | ORAL | 0 refills | Status: DC | PRN

## 2020-04-18 MED ORDER — PREDNISONE 20 MG PO TABS: 20.0000 mg | ORAL_TABLET | Freq: Every day | ORAL | 0 refills | Status: AC

## 2020-04-18 NOTE — Patient Instructions (Signed)
Shortness of breath Chest wall pain/ costochondritis:  Plan: Will check follow-up chest x ray and call with results  Continue low sodium diet  Please quit smoking  Will order prednisone for shortness of breath  Tylenol for pain  Alternate heat and ice packs to chest and ribs   Cough: -May trial gastroesophageal reflux disease treatment with elevating the head your bed and taking antacids -May trial taking over-the-counter antihistamines and nasal fluticasone to help with allergic rhinitis -You need to try to suppress your cough to allow your larynx (voice box) to heal.  - For three days don't talk, laugh, sing, or clear your throat. Do everything you can to suppress the cough during this time. -Use hard candies (sugarless Jolly Ranchers) or non-mint or non-menthol containing cough drops during this time to soothe your throat.   -Use a cough suppressant (Delsym or what I have prescribed you) around the clock during this time.  - After three days, gradually increase the use of your voice and back off on the cough suppressants.   Follow up: Follow up in 1 month or sooner if needed  

## 2020-04-18 NOTE — Assessment & Plan Note (Signed)
Shortness of breath Chest wall pain/ costochondritis:  Plan: Will check follow-up chest x ray and call with results  Continue low sodium diet  Please quit smoking  Will order prednisone for shortness of breath  Tylenol for pain  Alternate heat and ice packs to chest and ribs   Cough: -May trial gastroesophageal reflux disease treatment with elevating the head your bed and taking antacids -May trial taking over-the-counter antihistamines and nasal fluticasone to help with allergic rhinitis -You need to try to suppress your cough to allow your larynx (voice box) to heal.  - For three days don't talk, laugh, sing, or clear your throat. Do everything you can to suppress the cough during this time. -Use hard candies (sugarless Jolly Ranchers) or non-mint or non-menthol containing cough drops during this time to soothe your throat.   -Use a cough suppressant (Delsym or what I have prescribed you) around the clock during this time.  - After three days, gradually increase the use of your voice and back off on the cough suppressants.   Follow up: Follow up in 1 month or sooner if needed

## 2020-04-18 NOTE — Progress Notes (Signed)
@Patient  ID: Gabriel Petty, male    DOB: 08-Nov-1981, 39 y.o.   MRN: [id number]  Chief Complaint  Patient presents with  . Follow-up    Patient stated he is still having chest discomfort with cough pain goes into his back. Stated when this happens he just sits and lets it pass.     Referring provider: Leonard Downing, *  39 year old male with history of hypertension and cluster headaches, covid diagnosed in February 2021.  Recent significant events:  03/15/20 [facility] Initial Visit: Chest x ray ordered - showed possible pneumonia to left lung - treated with prednisone and azithromycin. Advised patient to quit smoking. Tylenol or motrin for pain. Alternate heat and ice packs to chest.   Imaging:  03/15/20 Chest X ray: Slight bronchitic changes. Small focal area of atelectasis or infiltrate at the left base laterally.  HPI  Patient presents today for post covid care follow up visit. Patient states that he did finish azithromycin and prednisone as ordered. He states that this did help relieve symptoms, but since he finished medications his shortness of breath and chest wall tenderness have returned. He continues to have non-productive cough and states that this make his chest hurt. He states that this is mostly on his right side. Patient admits that he does smoke daily, but has cut back to around 6 cigarettes per day. Patient states that his shortness of breath has improved since last visit. Denies f/c/s, n/v/d, hemoptysis, PND, chest pain or edema.      No Known Allergies  Immunization History  Administered Date(s) Administered  . Tdap 01/31/2017    Past Medical History:  Diagnosis Date  . Headaches, cluster   . Hypertension     Tobacco History: Social History   Tobacco Use  Smoking Status Current Every Day Smoker  . Packs/day: 0.25  . Types: Cigarettes  Smokeless Tobacco Never Used   Ready to quit: No Counseling given: Yes   Outpatient Encounter  Medications as of 04/18/2020  Medication Sig  . amLODipine (NORVASC) 5 MG tablet Take 1 tablet (5 mg total) by mouth daily.  . butalbital-acetaminophen-caffeine (FIORICET) 50-325-40 MG tablet Take 1 tablet by mouth every 6 (six) hours as needed for headache or migraine (Avoid taking more than 5 days consecutively this can cause rebound Headache).  Marland Kitchen azithromycin (ZITHROMAX) 250 MG tablet Take 2 tablets (500 mg) on day 1, then take 1 tablet (250 mg) on days 2-5 (Patient not taking: Reported on 04/18/2020)  . benzonatate (TESSALON) 100 MG capsule Take 1 capsule (100 mg total) by mouth 2 (two) times daily as needed for cough.  Marland Kitchen ibuprofen (ADVIL,MOTRIN) 200 MG tablet Take 800 mg by mouth every 8 (eight) hours as needed for headache.   . predniSONE (DELTASONE) 20 MG tablet Take 1 tablet (20 mg total) by mouth daily with breakfast for 5 days.  . [DISCONTINUED] cetirizine (ZYRTEC) 10 MG tablet Take 10 mg by mouth daily.  . [DISCONTINUED] diphenhydramine-acetaminophen (TYLENOL PM) 25-500 MG TABS tablet Take 1 tablet by mouth at bedtime as needed.   No facility-administered encounter medications on file as of 04/18/2020.     Review of Systems  Review of Systems  Constitutional: Negative.  Negative for chills and fever.  HENT: Negative.   Respiratory: Positive for cough and shortness of breath. Negative for wheezing.        Chest wall tenderness with cough.   Cardiovascular: Negative.  Negative for chest pain, palpitations and leg swelling.  Gastrointestinal: Negative.   Allergic/Immunologic: Negative.   Neurological: Negative.  Negative for weakness.  Psychiatric/Behavioral: Negative.  Negative for decreased concentration.       Physical Exam  BP 134/78 (BP Location: Right Arm, Patient Position: Sitting, Cuff Size: Small)   Pulse 68   Temp 97.8 F (36.6 C)   Ht 5\' 8"  (1.727 m)   Wt 177 lb 12 oz (80.6 kg)   SpO2 98%   BMI 27.03 kg/m   Wt Readings from Last 5 Encounters:  04/18/20 177  lb 12 oz (80.6 kg)  03/15/20 181 lb (82.1 kg)  02/26/20 182 lb (82.6 kg)  10/21/17 174 lb (78.9 kg)     Physical Exam Vitals and nursing note reviewed.  Constitutional:      General: He is not in acute distress.    Appearance: He is well-developed.  Cardiovascular:     Rate and Rhythm: Normal rate and regular rhythm.  Pulmonary:     Effort: Pulmonary effort is normal. No respiratory distress.     Breath sounds: Normal breath sounds. No wheezing or rhonchi.  Musculoskeletal:     Right lower leg: No edema.     Left lower leg: No edema.  Skin:    General: Skin is warm and dry.  Neurological:     Mental Status: He is alert and oriented to person, place, and time.      Lab Results:  CBC    Component Value Date/Time   WBC 8.0 03/15/2020 1523   WBC 8.6 02/26/2020 1449   RBC 4.98 03/15/2020 1523   RBC 5.69 02/26/2020 1449   HGB 15.8 03/15/2020 1523   HCT 44.7 03/15/2020 1523   PLT 300 03/15/2020 1523   MCV 90 03/15/2020 1523   MCH 31.7 03/15/2020 1523   MCH 31.1 02/26/2020 1449   MCHC 35.3 03/15/2020 1523   MCHC 34.2 02/26/2020 1449   RDW 13.0 03/15/2020 1523   LYMPHSABS 2.7 11/23/2018 1601   MONOABS 0.6 11/23/2018 1601   EOSABS 0.1 11/23/2018 1601   BASOSABS 0.0 11/23/2018 1601    BMET    Component Value Date/Time   NA 140 03/15/2020 1523   K 4.0 03/15/2020 1523   CL 103 03/15/2020 1523   CO2 23 03/15/2020 1523   GLUCOSE 93 03/15/2020 1523   GLUCOSE 98 02/26/2020 1449   BUN 9 03/15/2020 1523   CREATININE 1.05 03/15/2020 1523   CALCIUM 9.1 03/15/2020 1523   GFRNONAA 89 03/15/2020 1523   GFRAA 103 03/15/2020 1523     Assessment & Plan:   History of COVID-19 Shortness of breath Chest wall pain/ costochondritis:  Plan: Will check follow-up chest x ray and call with results  Continue low sodium diet  Please quit smoking  Will order prednisone for shortness of breath  Tylenol for pain  Alternate heat and ice packs to chest and  ribs   Cough: -May trial gastroesophageal reflux disease treatment with elevating the head your bed and taking antacids -May trial taking over-the-counter antihistamines and nasal fluticasone to help with allergic rhinitis -You need to try to suppress your cough to allow your larynx (voice box) to heal.  - For three days don't talk, laugh, sing, or clear your throat. Do everything you can to suppress the cough during this time. -Use hard candies (sugarless Jolly Ranchers) or non-mint or non-menthol containing cough drops during this time to soothe your throat.   -Use a cough suppressant (Delsym or what I have prescribed you) around the clock during  this time.  - After three days, gradually increase the use of your voice and back off on the cough suppressants.   Follow up: Follow up in 1 month or sooner if needed      Ivonne Andrew, NP 04/19/2020

## 2020-04-20 NOTE — Progress Notes (Signed)
Patient notified of results, Verbally understood. No additional questions.  

## 2020-05-02 ENCOUNTER — Telehealth (INDEPENDENT_AMBULATORY_CARE_PROVIDER_SITE_OTHER): Payer: BC Managed Care – PPO | Admitting: Internal Medicine

## 2020-05-02 ENCOUNTER — Encounter: Payer: Self-pay | Admitting: Internal Medicine

## 2020-05-02 DIAGNOSIS — R200 Anesthesia of skin: Secondary | ICD-10-CM

## 2020-05-02 DIAGNOSIS — Z8616 Personal history of COVID-19: Secondary | ICD-10-CM

## 2020-05-02 DIAGNOSIS — I10 Essential (primary) hypertension: Secondary | ICD-10-CM

## 2020-05-02 DIAGNOSIS — R0602 Shortness of breath: Secondary | ICD-10-CM

## 2020-05-02 DIAGNOSIS — R05 Cough: Secondary | ICD-10-CM

## 2020-05-02 DIAGNOSIS — R059 Cough, unspecified: Secondary | ICD-10-CM
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
## 2020-05-18 ENCOUNTER — Ambulatory Visit: Payer: BC Managed Care – PPO

## 2020-05-21 ENCOUNTER — Other Ambulatory Visit: Payer: Self-pay | Admitting: Family Medicine

## 2020-07-01 ENCOUNTER — Other Ambulatory Visit: Payer: Self-pay | Admitting: Family Medicine

## 2020-07-01 NOTE — Telephone Encounter (Signed)
Requested medication (s) are due for refill today: Yes  Requested medication (s) are on the active medication list: Yes  Last refill:  03/11/20  Future visit scheduled: Yes  Notes to clinic:  We do not do these refills. Thanks.    Requested Prescriptions  Pending Prescriptions Disp Refills   butalbital-acetaminophen-caffeine (FIORICET) 50-325-40 MG tablet [Pharmacy Med Name: BUTALB-ACETAMIN-CAFF 50-325-40] 30 tablet 2    Sig: TAKE 1 TABLET BY MOUTH EVERY 6 HOURS AS NEEDED FOR HEADACHE/MIGRAINE (AVOID >5 DAYS IN A ROW)      There is no refill protocol information for this order
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
## 2020-07-21 ENCOUNTER — Ambulatory Visit: Payer: BC Managed Care – PPO | Admitting: Neurology

## 2020-07-22 ENCOUNTER — Encounter: Payer: Self-pay | Admitting: Neurology

## 2020-08-04 ENCOUNTER — Telehealth: Payer: Self-pay

## 2020-08-04 NOTE — Telephone Encounter (Signed)
Called patient to do their pre-visit COVID screening.  Call went to voicemail, unable to do prescreening. Patient did confirm visit via MyChart though.

## 2020-08-05 ENCOUNTER — Encounter: Payer: Self-pay | Admitting: Internal Medicine

## 2020-08-05 ENCOUNTER — Ambulatory Visit (INDEPENDENT_AMBULATORY_CARE_PROVIDER_SITE_OTHER): Payer: BC Managed Care – PPO | Admitting: Internal Medicine

## 2020-08-05 VITALS — BP 136/89 | HR 63 | Temp 97.3°F | Resp 17 | Wt 170.0 lb

## 2020-08-05 DIAGNOSIS — R200 Anesthesia of skin: Secondary | ICD-10-CM | POA: Diagnosis not present

## 2020-08-05 DIAGNOSIS — Z13220 Encounter for screening for lipoid disorders: Secondary | ICD-10-CM

## 2020-08-05 DIAGNOSIS — Z1159 Encounter for screening for other viral diseases: Secondary | ICD-10-CM | POA: Diagnosis not present

## 2020-08-05 DIAGNOSIS — I10 Essential (primary) hypertension: Secondary | ICD-10-CM | POA: Diagnosis not present
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
## 2020-08-06 LAB — LIPID PANEL
Chol/HDL Ratio: 6.9 ratio — ABNORMAL HIGH (ref 0.0–5.0)
Cholesterol, Total: 221 mg/dL — ABNORMAL HIGH (ref 100–199)
HDL: 32 mg/dL — ABNORMAL LOW (ref >39)
LDL Chol Calc (NIH): 161 mg/dL — ABNORMAL HIGH (ref 0–99)
Triglycerides: 150 mg/dL — ABNORMAL HIGH (ref 0–149)
VLDL Cholesterol Cal: 28 mg/dL (ref 5–40)

## 2020-08-06 LAB — HCV AB W/RFLX TO VERIFICATION: HCV Ab: <0.1 s/co ratio (ref 0.0–0.9)

## 2020-08-06 LAB — HCV INTERPRETATION

## 2020-08-18 DIAGNOSIS — Z20822 Contact with and (suspected) exposure to covid-19: Secondary | ICD-10-CM | POA: Diagnosis not present

## 2020-08-18 DIAGNOSIS — Z03818 Encounter for observation for suspected exposure to other biological agents ruled out: Secondary | ICD-10-CM | POA: Diagnosis not present

## 2020-10-07 ENCOUNTER — Other Ambulatory Visit: Payer: Self-pay

## 2020-10-07 ENCOUNTER — Encounter: Payer: Self-pay | Admitting: Diagnostic Neuroimaging

## 2020-10-07 ENCOUNTER — Ambulatory Visit (INDEPENDENT_AMBULATORY_CARE_PROVIDER_SITE_OTHER): Payer: BC Managed Care – PPO | Admitting: Diagnostic Neuroimaging

## 2020-10-07 VITALS — BP 135/87 | HR 76 | Ht 68.0 in | Wt 171.4 lb

## 2020-10-07 DIAGNOSIS — R252 Cramp and spasm: Secondary | ICD-10-CM | POA: Diagnosis not present

## 2020-10-07 DIAGNOSIS — R200 Anesthesia of skin: Secondary | ICD-10-CM | POA: Diagnosis not present

## 2020-10-07 DIAGNOSIS — M542 Cervicalgia: Secondary | ICD-10-CM

## 2020-10-07 DIAGNOSIS — R292 Abnormal reflex: Secondary | ICD-10-CM
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
## 2020-10-07 NOTE — Patient Instructions (Signed)
-   check MRI cervical spine and labs

## 2020-10-08 LAB — CBC WITH DIFFERENTIAL/PLATELET
Basophils Absolute: 0 10*3/uL (ref 0.0–0.2)
Basos: 0 %
EOS (ABSOLUTE): 0.1 10*3/uL (ref 0.0–0.4)
Eos: 1 %
Hematocrit: 49 % (ref 37.5–51.0)
Hemoglobin: 17 g/dL (ref 13.0–17.7)
Immature Grans (Abs): 0 10*3/uL (ref 0.0–0.1)
Immature Granulocytes: 0 %
Lymphocytes Absolute: 2.2 10*3/uL (ref 0.7–3.1)
Lymphs: 26 %
MCH: 31.5 pg (ref 26.6–33.0)
MCHC: 34.7 g/dL (ref 31.5–35.7)
MCV: 91 fL (ref 79–97)
Monocytes Absolute: 0.5 10*3/uL (ref 0.1–0.9)
Monocytes: 6 %
Neutrophils Absolute: 5.5 10*3/uL (ref 1.4–7.0)
Neutrophils: 67 %
Platelets: 307 10*3/uL (ref 150–450)
RBC: 5.4 x10E6/uL (ref 4.14–5.80)
RDW: 12.9 % (ref 11.6–15.4)
WBC: 8.3 10*3/uL (ref 3.4–10.8)

## 2020-10-08 LAB — TSH: TSH: 0.939 u[IU]/mL (ref 0.450–4.500)

## 2020-10-08 LAB — MAGNESIUM: Magnesium: 2.3 mg/dL (ref 1.6–2.3)

## 2020-10-08 LAB — COMPREHENSIVE METABOLIC PANEL
ALT: 14 IU/L (ref 0–44)
AST: 19 IU/L (ref 0–40)
Albumin/Globulin Ratio: 1.9 (ref 1.2–2.2)
Albumin: 4.8 g/dL (ref 4.0–5.0)
Alkaline Phosphatase: 86 IU/L (ref 44–121)
BUN/Creatinine Ratio: 10 (ref 9–20)
BUN: 11 mg/dL (ref 6–20)
Bilirubin Total: 0.3 mg/dL (ref 0.0–1.2)
CO2: 23 mmol/L (ref 20–29)
Calcium: 9.7 mg/dL (ref 8.7–10.2)
Chloride: 101 mmol/L (ref 96–106)
Creatinine, Ser: 1.11 mg/dL (ref 0.76–1.27)
GFR calc Af Amer: 96 mL/min/{1.73_m2} (ref >59)
GFR calc non Af Amer: 83 mL/min/{1.73_m2} (ref >59)
Globulin, Total: 2.5 g/dL (ref 1.5–4.5)
Glucose: 97 mg/dL (ref 65–99)
Potassium: 4.4 mmol/L (ref 3.5–5.2)
Sodium: 140 mmol/L (ref 134–144)
Total Protein: 7.3 g/dL (ref 6.0–8.5)

## 2020-10-08 LAB — HEMOGLOBIN A1C
Est. average glucose Bld gHb Est-mCnc: 117 mg/dL
Hgb A1c MFr Bld: 5.7 % — ABNORMAL HIGH (ref 4.8–5.6)

## 2020-10-08 LAB — VITAMIN B12: Vitamin B-12: 622 pg/mL (ref 232–1245)

## 2020-10-10 ENCOUNTER — Encounter: Payer: Self-pay | Admitting: *Deleted

## 2020-10-11 ENCOUNTER — Telehealth: Payer: Self-pay | Admitting: Diagnostic Neuroimaging

## 2020-10-11 NOTE — Telephone Encounter (Signed)
[employer] no auth via [employer]. Patient is scheduled at GI for 10/12/20.

## 2020-10-12 ENCOUNTER — Ambulatory Visit
Admission: RE | Admit: 2020-10-12 | Discharge: 2020-10-12 | Disposition: A | Discharge status: Home / Self Care | Payer: BC Managed Care – PPO | Source: Ambulatory Visit | Attending: Diagnostic Neuroimaging | Admitting: Diagnostic Neuroimaging

## 2020-10-12 DIAGNOSIS — M542 Cervicalgia: Secondary | ICD-10-CM | POA: Diagnosis not present

## 2020-10-12 DIAGNOSIS — R292 Abnormal reflex: Secondary | ICD-10-CM | POA: Diagnosis not present

## 2020-10-12 DIAGNOSIS — R252 Cramp and spasm: Secondary | ICD-10-CM

## 2020-10-12 DIAGNOSIS — R200 Anesthesia of skin: Secondary | ICD-10-CM

## 2020-10-12 MED ORDER — GADOBENATE DIMEGLUMINE 529 MG/ML IV SOLN
16.0000 mL | Freq: Once | INTRAVENOUS | Status: AC | PRN
  Administered 2020-10-12: 16 mL via INTRAVENOUS

## 2020-10-18 ENCOUNTER — Telehealth: Payer: Self-pay | Admitting: *Deleted

## 2020-10-18 DIAGNOSIS — M4802 Spinal stenosis, cervical region: Secondary | ICD-10-CM

## 2020-10-18 DIAGNOSIS — M542 Cervicalgia: Secondary | ICD-10-CM

## 2020-10-18 DIAGNOSIS — R292 Abnormal reflex: Secondary | ICD-10-CM

## 2020-10-18 NOTE — Telephone Encounter (Signed)
Spoke with patient and informed him the MRI cervical spine showed mild spinal stenosis at C6-7; Dr Marjory Lies recommends a neurosurgery consult. patient would like a referral; I advised will place it today. Patient verbalized understanding, appreciation.

## 2020-10-20 DIAGNOSIS — I10 Essential (primary) hypertension: Secondary | ICD-10-CM | POA: Diagnosis not present

## 2020-10-20 DIAGNOSIS — M5412 Radiculopathy, cervical region: Secondary | ICD-10-CM | POA: Diagnosis not present

## 2020-10-20 DIAGNOSIS — G959 Disease of spinal cord, unspecified: Secondary | ICD-10-CM | POA: Diagnosis not present

## 2020-10-20 DIAGNOSIS — M542 Cervicalgia: Secondary | ICD-10-CM | POA: Diagnosis not present

## 2020-11-06 ENCOUNTER — Other Ambulatory Visit: Payer: Self-pay | Admitting: Family Medicine

## 2020-11-11 DIAGNOSIS — G959 Disease of spinal cord, unspecified: Secondary | ICD-10-CM | POA: Diagnosis not present

## 2020-11-15 DIAGNOSIS — M4802 Spinal stenosis, cervical region: Secondary | ICD-10-CM | POA: Diagnosis not present

## 2020-11-15 DIAGNOSIS — G959 Disease of spinal cord, unspecified: Secondary | ICD-10-CM | POA: Diagnosis not present

## 2020-11-15 DIAGNOSIS — M50022 Cervical disc disorder at C5-C6 level with myelopathy: Secondary | ICD-10-CM | POA: Diagnosis not present

## 2020-11-15 HISTORY — PX: CERVICAL FUSION: SHX112

## 2021-02-06 ENCOUNTER — Ambulatory Visit: Payer: Self-pay | Admitting: Internal Medicine

## 2021-02-08 ENCOUNTER — Ambulatory Visit: Payer: Self-pay

## 2021-02-08 ENCOUNTER — Other Ambulatory Visit: Payer: Self-pay | Admitting: Internal Medicine

## 2021-02-08 DIAGNOSIS — R519 Headache, unspecified: Secondary | ICD-10-CM

## 2021-02-09 ENCOUNTER — Telehealth: Payer: Self-pay | Admitting: Diagnostic Neuroimaging

## 2021-02-09 ENCOUNTER — Other Ambulatory Visit: Payer: Self-pay | Admitting: Internal Medicine

## 2021-02-09 DIAGNOSIS — R519 Headache, unspecified: Secondary | ICD-10-CM

## 2021-02-09 NOTE — Telephone Encounter (Signed)
PT sent mychart message asking to see Dr. Marjory Lies for headaches. He has seen Dr. Demetrius Charity in the past for arm numbness. PT said his PCP sent [place] a referral, but it was sent to [facility]. I messaged PT back to let him know, gave their number and our fax number if he wants his PCP to send the referral to [place].

## 2021-02-09 NOTE — Telephone Encounter (Signed)
Switched referral to [facility].   Gabriel Petty, D.O. Primary Care at [facility]  02/09/2021, 11:45 AM

## 2021-03-06 ENCOUNTER — Other Ambulatory Visit: Payer: Self-pay

## 2021-03-06 ENCOUNTER — Other Ambulatory Visit: Payer: Self-pay | Admitting: Internal Medicine

## 2021-03-06 ENCOUNTER — Ambulatory Visit: Payer: Managed Care, Other (non HMO) | Admitting: Diagnostic Neuroimaging

## 2021-03-06 ENCOUNTER — Encounter: Payer: Self-pay | Admitting: Diagnostic Neuroimaging

## 2021-03-06 VITALS — BP 132/81 | HR 79 | Ht 68.0 in | Wt 178.6 lb

## 2021-03-06 DIAGNOSIS — G43109 Migraine with aura, not intractable, without status migrainosus: Secondary | ICD-10-CM | POA: Diagnosis not present

## 2021-03-06 MED ORDER — AIMOVIG 70 MG/ML ~~LOC~~ SOAJ: 70.0000 mg | SUBCUTANEOUS | 4 refills | Status: DC

## 2021-03-06 MED ORDER — NURTEC 75 MG PO TBDP: 75.0000 mg | ORAL_TABLET | Freq: Every day | ORAL | 6 refills | Status: DC | PRN
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
## 2021-03-06 NOTE — Patient Instructions (Signed)
   MIGRAINE PREVENTION  LIFESTYLE CHANGES -Stop or avoid smoking -Decrease or avoid caffeine / alcohol -Eat and sleep on a regular schedule -Exercise several times per week - start erenumab (Aimovig) 70mg  monthly injections   MIGRAINE RESCUE  - ibuprofen, tylenol as needed - rimegepant (Nurtec) 75mg  as needed for breakthrough headache; max 8 per month

## 2021-03-07 ENCOUNTER — Telehealth: Payer: Self-pay | Admitting: *Deleted

## 2021-03-07 NOTE — Telephone Encounter (Signed)
Aimovig PA< key: [id number], [id number], failed: topiramate caused numbness; maxalt, fioricet, zomig, sumatriptan ineffective; nortriptyline caused numbness, trigger point injections caused double vision and off balance feeling.  [employer] is reviewing your PA request and will respond within 24 hours for Medicaid or up to 72 hours for non-Medicaid plans,

## 2021-03-09 ENCOUNTER — Encounter: Payer: Self-pay | Admitting: *Deleted

## 2021-03-09 NOTE — Telephone Encounter (Signed)
Aimovig approved, [id number]; Coverage Start Date:03/07/2021;Coverage End Date:03/08/2022. Sent my chart to inform patient.

## 2021-05-21 ENCOUNTER — Other Ambulatory Visit: Payer: Self-pay | Admitting: Internal Medicine

## 2021-05-24 ENCOUNTER — Telehealth: Payer: Self-pay | Admitting: *Deleted

## 2021-05-24 ENCOUNTER — Encounter: Payer: Self-pay | Admitting: *Deleted

## 2021-05-24 NOTE — Telephone Encounter (Addendum)
Nurtec PA, key: [id number], [id number]. Received :  The patient's zip code and/or phone number provided do not match our records. Sent my chart asking for address connected with insurance. Got new address and submitted PA, updated EMR. Tried/failed: topiramate, nortriptyline, maxalt, sumatriptan, zomig, fioricet, trigger point injections.  Case [id number]; Approved Coverage Start Date:05/24/2021; End Date:05/24/2022. Sent my chart to advise patient.

## 2021-09-11 ENCOUNTER — Telehealth: Payer: Self-pay | Admitting: Diagnostic Neuroimaging

## 2021-09-11 NOTE — Telephone Encounter (Signed)
I attempted to reach the pt. Pt was not available. Will try again at a later time.

## 2021-09-11 NOTE — Telephone Encounter (Signed)
Pt called, have rescheduled appt. Would like a call from the nurse to discuss a sooner appt than 01/01/2022

## 2021-09-11 NOTE — Telephone Encounter (Signed)
Pt called back and we rescheduled for 09/25/2021 at 1 pm. Pt advised to check in 15-30 mins prior to visit.

## 2021-09-13 ENCOUNTER — Ambulatory Visit: Payer: Managed Care, Other (non HMO) | Admitting: Family Medicine

## 2021-09-25 ENCOUNTER — Encounter: Payer: Self-pay | Admitting: Diagnostic Neuroimaging

## 2021-09-25 ENCOUNTER — Ambulatory Visit (INDEPENDENT_AMBULATORY_CARE_PROVIDER_SITE_OTHER): Payer: Managed Care, Other (non HMO) | Admitting: Diagnostic Neuroimaging

## 2021-09-25 ENCOUNTER — Other Ambulatory Visit: Payer: Self-pay

## 2021-09-25 VITALS — BP 148/102 | HR 78 | Ht 68.0 in | Wt 168.2 lb

## 2021-09-25 DIAGNOSIS — M79642 Pain in left hand: Secondary | ICD-10-CM | POA: Diagnosis not present
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
## 2021-09-27 ENCOUNTER — Ambulatory Visit (INDEPENDENT_AMBULATORY_CARE_PROVIDER_SITE_OTHER): Payer: Managed Care, Other (non HMO) | Admitting: Family Medicine

## 2021-09-27 ENCOUNTER — Ambulatory Visit: Payer: Self-pay

## 2021-09-27 ENCOUNTER — Ambulatory Visit
Admission: RE | Admit: 2021-09-27 | Discharge: 2021-09-27 | Disposition: A | Discharge status: Home / Self Care | Payer: Managed Care, Other (non HMO) | Source: Ambulatory Visit | Attending: Family Medicine | Admitting: Family Medicine

## 2021-09-27 VITALS — Ht 68.0 in | Wt 166.0 lb

## 2021-09-27 DIAGNOSIS — M79642 Pain in left hand: Secondary | ICD-10-CM

## 2021-09-27 MED ORDER — MELOXICAM 15 MG PO TABS: 15.0000 mg | ORAL_TABLET | Freq: Every day | ORAL | 2 refills | Status: DC

## 2021-09-27 NOTE — Patient Instructions (Signed)
Get x-rays after you leave today to make sure you didn't break one of the bones in the wrist. You have a traumatic deQuervain's tenosynovitis of your thumb/wrist. Avoid painful activities as much as possible. Wear the thumb spica brace as often as possible to rest this. Ice 15 minutes at a time 3-4 times a day. Take meloxicam 15mg  daily with food for pain and inflammation - take for 7 days then as needed. A cortisone injection typically helps a great deal with this and is an option if this isn't improving Follow up with me in 5-6 weeks for reevaluation.

## 2021-09-27 NOTE — Progress Notes (Signed)
PCP: Arvilla Market, MD  Subjective:   HPI: Patient is a 40 y.o. male here for left wrist injury.  Patient reports about 5-6 weeks ago the gate on his very heavy wooden privacy fence closed onto his left wrist/hand. Immediate pain and swelling that has persisted from radial through to ulnar side of left wrist. He is ambidextrous. Difficulty picking up items and holding his phone. Has some numbness but attributes this to his known cervical spine issues - no change since this new injury.  Past Medical History:  Diagnosis Date   Headaches, cluster    Hypertension     Current Outpatient Medications on File Prior to Visit  Medication Sig Dispense Refill   amLODipine (NORVASC) 5 MG tablet TAKE 1 TABLET BY MOUTH EVERY DAY 30 tablet 0   butalbital-acetaminophen-caffeine (FIORICET) 50-325-40 MG tablet TAKE 1 TABLET BY MOUTH EVERY 6 HOURS AS NEEDED FOR HEADACHE/MIGRAINE (AVOID >5 DAYS IN A ROW) 30 tablet 2   Erenumab-aooe (AIMOVIG) 70 MG/ML SOAJ Inject 70 mg into the skin every 30 (thirty) days. 3 mL 4   Rimegepant Sulfate (NURTEC) 75 MG TBDP Take 75 mg by mouth daily as needed. 8 tablet 6   [DISCONTINUED] cetirizine (ZYRTEC) 10 MG tablet Take 10 mg by mouth daily.     [DISCONTINUED] diphenhydramine-acetaminophen (TYLENOL PM) 25-500 MG TABS tablet Take 1 tablet by mouth at bedtime as needed.     No current facility-administered medications on file prior to visit.    Past Surgical History:  Procedure Laterality Date   arm surgery  2003   plate, screws R arm   CERVICAL FUSION  11/15/2020   C5-7    No Known Allergies  Ht 5\' 8"  (1.727 m)   Wt 166 lb (75.3 kg)   BMI 25.24 kg/m   [facility] 09/27/[facility]21  Frequency of aerobic exercise (# of days/week) 5  Average time in minutes 25  Frequency of strengthening activities (# of days/week) 3    No flowsheet data found.      Objective:  Physical Exam:  Gen: NAD, comfortable in exam room  Left  wrist/hand: No deformity, bruising.  Mild soft tissue swelling radial side of wrist over 1st dorsal compartment. FROM with 5/5 strength - pain with resisted thumb abduction. Tenderness to palpation greatest over 1st dorsal compartment though mild diffuse dorsal pain of wrist. NVI distally. Negative tinels, positive finkelsteins.   Limited MSK u/s left wrist:  1st dorsal compartment tendons intact but with notable tenosynovitis.  2nd-6th dorsal compartments normal.  No cortical irregularities, edema overlying cortex of scaphoid, radius, ulna. Assessment & Plan:  1. Left wrist injury - due to crush injury.  Likely traumatic deQuervain's tenosynovitis but will obtain radiographs as well of his wrist.  Thumb spica brace, icing, meloxicam.  F/u in 5-6 weeks.

## 2021-10-30 ENCOUNTER — Ambulatory Visit (INDEPENDENT_AMBULATORY_CARE_PROVIDER_SITE_OTHER): Payer: Managed Care, Other (non HMO) | Admitting: Family Medicine

## 2021-10-30 VITALS — BP 126/82 | Ht 68.0 in | Wt 166.0 lb

## 2021-10-30 DIAGNOSIS — M25532 Pain in left wrist: Secondary | ICD-10-CM

## 2021-10-30 NOTE — Patient Instructions (Signed)
For your wrist pain we are ordering occupational therapy.  I recommend that she start coming out of the brace slowly.  When recommendation would be to start wearing it during the day but not at night.  Do make sure that when you wear it during the day you come out of it several times throughout the day to do wrist mobility exercises as we demonstrated today.  Follow-up in 4 to 6 weeks.  If your wrist pain is completely resolved or significantly improved you do not have to follow-up.

## 2021-10-30 NOTE — Progress Notes (Addendum)
SUBJECTIVE:   CHIEF COMPLAINT / HPI:   Follow-up-wrist injury, left: 40 year old male present for follow-up for left wrist injury.  Patient was seen on 10/5 after an injury occurred where the gate on a wooden privacy fence was closed onto his left hand.  This had occurred 5 to 6 weeks prior to that appointment.  At the time of that appointment he was recommended to get x-rays. X-ray showed no acute fracture or dislocation.  Ultrasound showed first dorsal compartment tendons intact with notable tenderness of first dorsal compartment with second through sixth dorsal compartments normal and no cortical irregularities overlying the cortex of scaphoid, radius, ulna.  Patient was noted to have traumatic de Quervain's tenosynovitis of the left wrist.  He was recommended wear thumb spica as often as possible and ice 15 minutes 3-4 times per day as well as taking meloxicam for 7 days.   Today states his pain is worsened since the last appointment.  He denies any new trauma or injury.  He states that he has been wearing the brace throughout the day and when he sleeps.  The only time he does not wear it is when he knows he will be washing his hands or showering.  He indicates that the pain has shifted from the lateral aspect of the wrist to he middle of the wrist and the medial side.  He is not experiencing any radiation, no numbness or tingling of the fingertips, and does experience some stiffness of the wrist.  He has tried to do some hand strengthening exercises a few times per day working on grip strength but this has not seemed to help.  He also states that the meloxicam that he initially received did help a bit but it did not fully relieve the pain.  He states that he primarily experiences pain when picking up something heavy with that wrist with the worst aspect of the pain being in the center to medial aspect of the wrist were previously was lateral.  OBJECTIVE:   BP 126/82   Ht 5\' 8"  (1.727 m)   Wt  166 lb (75.3 kg)   BMI 25.24 kg/m    General: NAD, pleasant, able to participate in exam MSK: Bilateral wrist warm and well-perfused.  No injury or lesions present on either wrist. Left wrist: No swelling, bruising, other deformity.  Some pain with palpation on the lateral aspect of the wrist, medial volar and dorsal wrist with none being necessarily more painful than the other.  Patient has 5/5 strength in grip, individual finger flexion, wrist extension.  He has 4/5 strength in left wrist flexion which he attributes to discomfort.  He has positive Finkelstein's on the left.  Negative Tinel's.  He has normal sensation present in the distal left hand, wrist, digits.  ASSESSMENT/PLAN:    Left wrist pain: 40 year old male present for follow-up after being diagnosed with traumatic de Quervain's tenosynovitis of the left wrist after crush injury to it about 8 weeks ago.  Initial x-rays were negative for acute fracture but ultrasound suggested de Quervain's tenosynovitis. His pain is now more prominent in the deep and medial aspect of the left wrist and is accompanied by some stiffness after wearing the brace throughout the day and at night while sleeping.  He has no further bouts of injury to the area, is neurovascularly intact, and remains to have a positive 41 on the left though this does not bother him to the extent of the rest of the wrist.  His main complaint seems to be stiffness and discomfort and I think this is likely due to the use of the brace.  No pain in the region of the scaphoid to suggest the need for additional imaging.  The area has no neuropathic findings suggestive of complex regional pain syndrome.  Tendons appear intact and no signs of median or ulnar nerve pathology as the cause of the patient's symptoms based off his clinical presentation and physical exam.  With the diffuse nature of the patient's pain and stiffness we will recommend reducing the use of the brace.  We  discussed multiple options with the patient and he opted for occupational therapy.  We will order this and recommend the patient start coming out of the brace at night and several times throughout the day to do wrist mobility exercises which were demonstrated today.  Follow-up in 4 to 6 weeks or sooner if needed.  Jackelyn Poling, DO [facility]

## 2021-10-31 ENCOUNTER — Encounter: Payer: Self-pay | Admitting: Family Medicine

## 2021-11-27 ENCOUNTER — Ambulatory Visit: Payer: Managed Care, Other (non HMO) | Admitting: Family Medicine

## 2021-12-10 ENCOUNTER — Telehealth: Payer: Managed Care, Other (non HMO) | Admitting: Physician Assistant

## 2021-12-10 DIAGNOSIS — H1032 Unspecified acute conjunctivitis, left eye: Secondary | ICD-10-CM

## 2021-12-10 MED ORDER — ERYTHROMYCIN 5 MG/GM OP OINT: TOPICAL_OINTMENT | OPHTHALMIC | 0 refills | Status: DC

## 2021-12-10 NOTE — Patient Instructions (Signed)
1. Acute conjunctivitis of left eye, unspecified acute conjunctivitis type  - erythromycin 3x per day  - f/u with ophthomology if no improvement  - urgent care for fevers, vision changes or worsening symptoms

## 2021-12-10 NOTE — Progress Notes (Signed)
Mr. daquon, Petty are scheduled for a virtual visit with your provider today.    Just as we do with appointments in the office, we must obtain your consent to participate.  Your consent will be active for this visit and any virtual visit you may have with one of our providers in the next 365 days.    If you have a MyChart account, I can also send a copy of this consent to you electronically.  All virtual visits are billed to your insurance company just like a traditional visit in the office.  As this is a virtual visit, video technology does not allow for your provider to perform a traditional examination.  This may limit your provider's ability to fully assess your condition.  If your provider identifies any concerns that need to be evaluated in person or the need to arrange testing such as labs, EKG, etc, we will make arrangements to do so.    Although advances in technology are sophisticated, we cannot ensure that it will always work on either your end or our end.  If the connection with a video visit is poor, we may have to switch to a telephone visit.  With either a video or telephone visit, we are not always able to ensure that we have a secure connection.   I need to obtain your verbal consent now.   Are you willing to proceed with your visit today?   [name] has provided verbal consent on 12/10/2021 for a virtual visit (video or telephone).   [name], PA-C 12/10/2021  12:16 PM   Date:  12/10/2021   ID:  Gabriel Petty, DOB [birth year], MRN [id number]  Patient Location: Other:  work Provider Location: Home Office   Participants: Patient and Provider for Visit and Wrap up  Method of visit: Video  Location of Patient: Home Location of Provider: Home Office Consent was obtain for visit over the video. Services rendered by provider: Visit was performed via video  A video enabled telemedicine application was used and I verified that I am speaking with the correct person  using two identifiers.  PCP:  Arvilla Market, MD   Chief Complaint:  Left eye swelling  History of Present Illness:    Gabriel Petty is a 40 y.o. male with history as stated below. Presents video telehealth for an acute care visit.   Pt reports he awoke this morning with persistent redness, irritation and mild swelling. Pt reports he believes he might have rubbed it with his arm brace overnight.  Pt reports the conjunctiva is red and irritated.  There is no discharge.  He does not wear glasses or contacts.  He is not having fever or vision changes.  No headaches.  Pt has not been around young children or anyone else with pink-eye.  No known injuries.    Modifying factors include: none  No other aggravating or relieving factors.  No other c/o.  Past Medical, Surgical, Social History, Allergies, and Medications have been Reviewed.  Patient Active Problem List   Diagnosis Date Noted   Shortness of breath 03/15/2020   History of COVID-19 03/15/2020   Chest wall pain 03/15/2020    Social History   Tobacco Use   Smoking status: Every Day    Packs/day: 0.25    Types: Cigarettes   Smokeless tobacco: Never   Tobacco comments:    09/25/21 4 a day  Substance Use Topics   Alcohol use: No     Current Outpatient Medications:  erythromycin ophthalmic ointment, Place 1/2 in into lower eye lid, 3x per day, Disp: 1 g, Rfl: 0   amLODipine (NORVASC) 5 MG tablet, TAKE 1 TABLET BY MOUTH EVERY DAY, Disp: 30 tablet, Rfl: 0   butalbital-acetaminophen-caffeine (FIORICET) 50-325-40 MG tablet, TAKE 1 TABLET BY MOUTH EVERY 6 HOURS AS NEEDED FOR HEADACHE/MIGRAINE (AVOID >5 DAYS IN A ROW), Disp: 30 tablet, Rfl: 2   Erenumab-aooe (AIMOVIG) 70 MG/ML SOAJ, Inject 70 mg into the skin every 30 (thirty) days., Disp: 3 mL, Rfl: 4   meloxicam (MOBIC) 15 MG tablet, Take 1 tablet (15 mg total) by mouth daily., Disp: 30 tablet, Rfl: 2   Rimegepant Sulfate (NURTEC) 75 MG TBDP, Take 75 mg by mouth  daily as needed., Disp: 8 tablet, Rfl: 6   No Known Allergies   Review of Systems  Constitutional:  Negative for chills and fever.  HENT:  Negative for congestion, ear pain and sore throat.   Eyes:  Positive for redness. Negative for blurred vision, double vision, photophobia, pain and discharge.  Respiratory:  Negative for cough, shortness of breath and wheezing.   Cardiovascular:  Negative for chest pain, palpitations and leg swelling.  Gastrointestinal:  Negative for abdominal pain, diarrhea, nausea and vomiting.  Genitourinary:  Negative for dysuria.  Musculoskeletal:  Negative for myalgias.  Skin:  Negative for rash.  Neurological:  Negative for loss of consciousness, weakness and headaches.  Psychiatric/Behavioral:  The patient is not nervous/anxious.   See HPI for history of present illness.  Physical Exam Constitutional:      General: He is not in acute distress.    Appearance: Normal appearance. He is well-developed. He is not ill-appearing.  HENT:     Head: Normocephalic and atraumatic.     Nose: Nose normal.  Eyes:     General: No scleral icterus.       Right eye: No discharge.        Left eye: No discharge.     Extraocular Movements: Extraocular movements intact.     Comments: Injected conjunctiva of the L eye; no injection of the right eye Minimal periorbital swelling without tenderness on directed exam No visible discharge No proptosis  Pulmonary:     Effort: Pulmonary effort is normal.  Musculoskeletal:        General: Normal range of motion.     Cervical back: Normal range of motion.  Skin:    Coloration: Skin is not pale.  Neurological:     General: No focal deficit present.     Mental Status: He is alert.  Psychiatric:        Mood and Affect: Mood normal.             A&P  1. Acute conjunctivitis of left eye, unspecified acute conjunctivitis type  - erythromycin 3x per day  - f/u with ophthomology if no improvement  - urgent care for fevers,  vision changes or worsening symptoms   Patient voiced understanding and agreement to plan.   Time:   Today, I have spent 15 minutes with the patient with telehealth technology discussing the above problems, reviewing the chart, previous notes, medications and orders.    Tests Ordered: No orders of the defined types were placed in this encounter.   Medication Changes: Meds ordered this encounter  Medications   erythromycin ophthalmic ointment    Sig: Place 1/2 in into lower eye lid, 3x per day    Dispense:  1 g    Refill:  0  Disposition:  Follow up urgent care for worsening symptoms, ophthalmology as needed if no improvement.  Yetta Barre, PA-C  12/10/2021 12:24 PM

## 2022-01-01 ENCOUNTER — Ambulatory Visit: Payer: Managed Care, Other (non HMO) | Admitting: Family Medicine

## 2022-02-06 ENCOUNTER — Telehealth: Payer: Self-pay | Admitting: *Deleted

## 2022-02-06 ENCOUNTER — Encounter: Payer: Self-pay | Admitting: *Deleted

## 2022-02-06 NOTE — Telephone Encounter (Signed)
Aimovig PA, key [id number]. Cannot find matching patient. Sent my chart for current insurance information.

## 2022-03-15 ENCOUNTER — Other Ambulatory Visit: Payer: Self-pay | Admitting: Internal Medicine

## 2022-03-15 IMAGING — CR DG CHEST 2V
2 series · 2 of 2 positions shown · non-contrast
Comparison: 11/23/2018

CLINICAL DATA: Chest pain and shortness of breath since developing
COVID 7 weeks ago.

EXAM:
CHEST - 2 VIEW

[w chest lat]
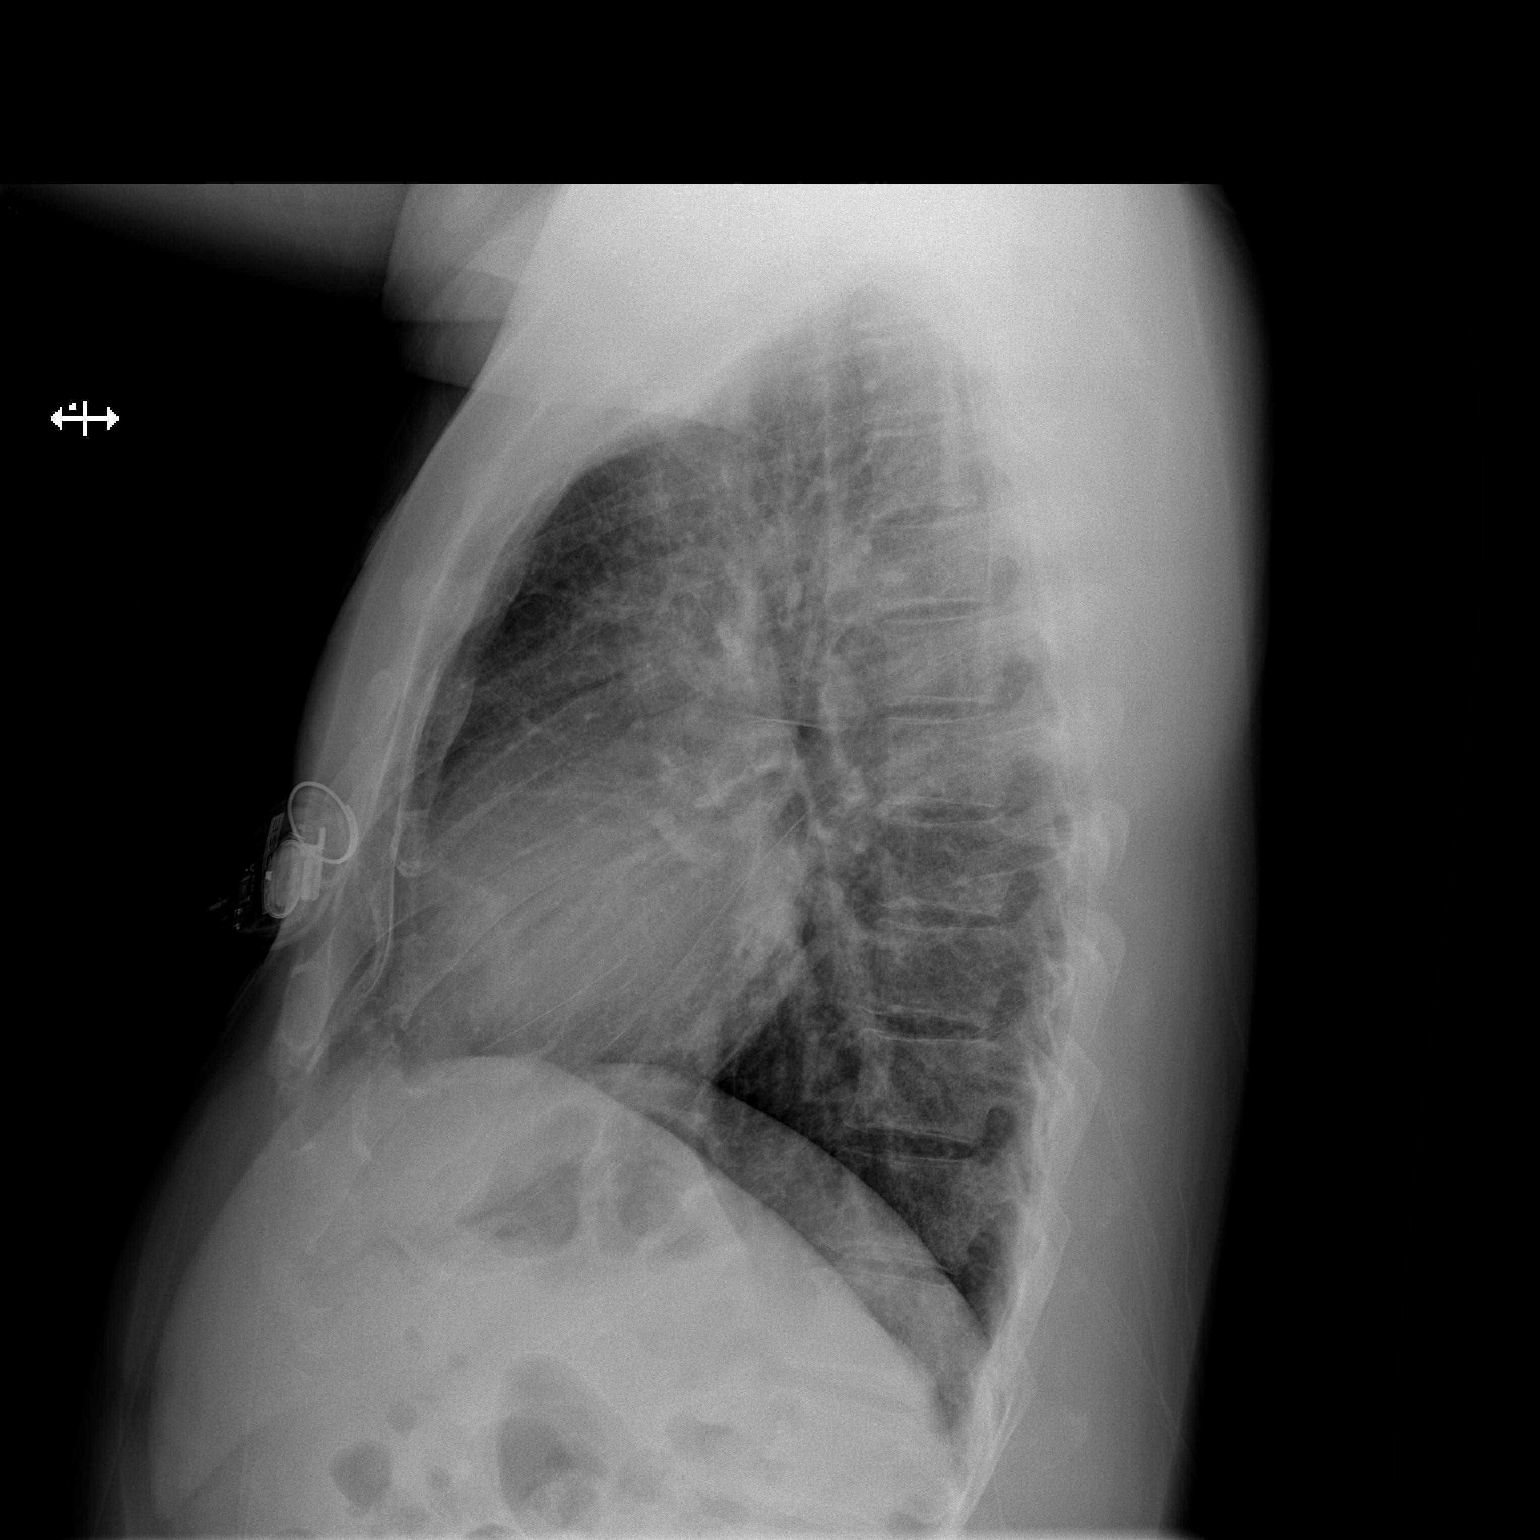

[w chest pa]
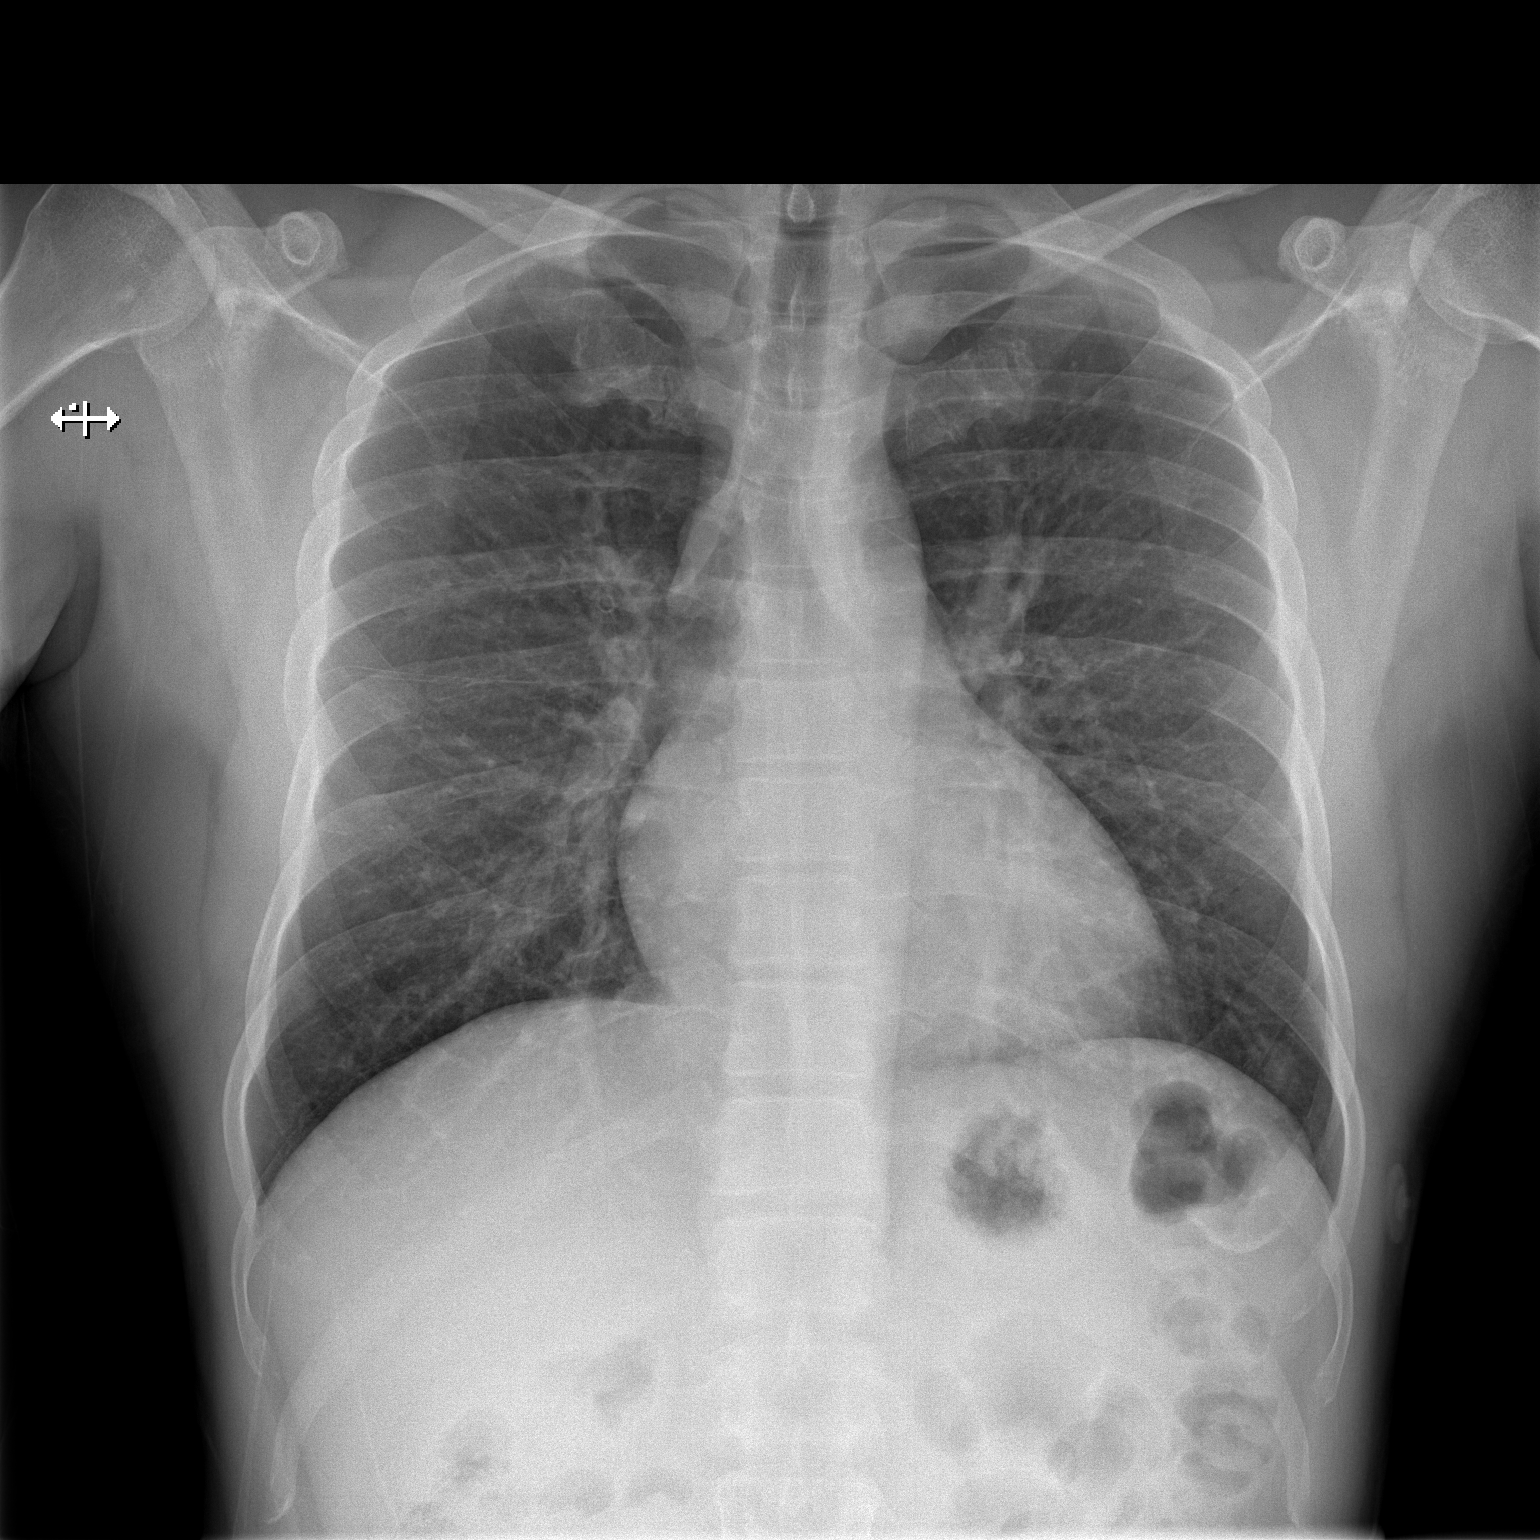

[2 of 2 positions shown; findings below may reference images not displayed]

FINDINGS: The heart size and mediastinal contours are within normal limits.
There is slight bilateral peribronchial thickening. There is a tiny
area increased density at the left base laterally. The lungs are
otherwise clear. The visualized skeletal structures are
unremarkable.
IMPRESSION: Slight bronchitic changes. Small focal area of atelectasis or
infiltrate at the left base laterally.

## 2022-10-01 ENCOUNTER — Telehealth: Payer: Self-pay | Admitting: Diagnostic Neuroimaging

## 2023-01-14 ENCOUNTER — Ambulatory Visit: Payer: 59 | Admitting: Diagnostic Neuroimaging

## 2023-01-16 ENCOUNTER — Encounter: Payer: Self-pay | Admitting: Diagnostic Neuroimaging

## 2023-01-16 ENCOUNTER — Ambulatory Visit (INDEPENDENT_AMBULATORY_CARE_PROVIDER_SITE_OTHER): Payer: Commercial Managed Care - PPO | Admitting: Diagnostic Neuroimaging

## 2023-01-16 VITALS — BP 123/85 | HR 85 | Ht 68.0 in | Wt 173.0 lb

## 2023-01-16 DIAGNOSIS — G43109 Migraine with aura, not intractable, without status migrainosus: Secondary | ICD-10-CM

## 2023-01-16 MED ORDER — NURTEC 75 MG PO TBDP: 75.0000 mg | ORAL_TABLET | ORAL | 12 refills | Status: DC

## 2023-01-16 NOTE — Patient Instructions (Signed)
MIGRAINE PREVENTION (now 12-15 HA per month) LIFESTYLE CHANGES -Stop or avoid smoking -Decrease or avoid caffeine / alcohol -Eat and sleep on a regular schedule -Exercise several times per week - nurtec 75mg  every other day (for migraine prevention) - or consider atogepant Lenoria Chime) 60mg  daily
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
## 2023-01-23 ENCOUNTER — Encounter: Payer: Self-pay | Admitting: Diagnostic Neuroimaging

## 2023-01-24 NOTE — Telephone Encounter (Signed)
PA submitted via CMM Key: [id number]  Your information has been sent to [facility], awaiting determination.

## 2023-01-24 NOTE — Telephone Encounter (Signed)
I will submit  

## 2023-01-28 ENCOUNTER — Telehealth: Payer: Self-pay | Admitting: Physician Assistant

## 2023-01-28 DIAGNOSIS — Z91199 Patient's noncompliance with other medical treatment and regimen due to unspecified reason: Secondary | ICD-10-CM

## 2023-01-28 NOTE — Progress Notes (Signed)
The patient no-showed for appointment despite this provider sending direct link x 2 with no response and waiting for at least 10 minutes from appointment time for patient to join. They will be marked as a NS for this appointment/time.   Gabriel Petty M Kiran Carline, PA-C    

## 2023-01-29 ENCOUNTER — Other Ambulatory Visit (HOSPITAL_COMMUNITY): Payer: Self-pay

## 2023-06-02 ENCOUNTER — Emergency Department (HOSPITAL_BASED_OUTPATIENT_CLINIC_OR_DEPARTMENT_OTHER)
Admission: EM | Admit: 2023-06-02 | Discharge: 2023-06-02 | Disposition: A | Discharge status: Home / Self Care | Payer: Commercial Managed Care - PPO | Attending: Emergency Medicine | Admitting: Emergency Medicine

## 2023-06-02 ENCOUNTER — Encounter (HOSPITAL_BASED_OUTPATIENT_CLINIC_OR_DEPARTMENT_OTHER): Payer: Self-pay | Admitting: Emergency Medicine

## 2023-06-02 ENCOUNTER — Other Ambulatory Visit: Payer: Self-pay

## 2023-06-02 DIAGNOSIS — F17210 Nicotine dependence, cigarettes, uncomplicated: Secondary | ICD-10-CM | POA: Diagnosis not present

## 2023-06-02 DIAGNOSIS — S0993XA Unspecified injury of face, initial encounter: Secondary | ICD-10-CM | POA: Insufficient documentation

## 2023-06-02 DIAGNOSIS — W228XXA Striking against or struck by other objects, initial encounter: Secondary | ICD-10-CM | POA: Diagnosis not present

## 2023-06-02 MED ORDER — MUPIROCIN CALCIUM 2 % EX CREA: 1.0000 | TOPICAL_CREAM | Freq: Two times a day (BID) | CUTANEOUS | 0 refills | Status: DC
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
## 2023-06-02 NOTE — Discharge Instructions (Signed)
Swelling on your lip is due to recent trauma. If you notice increase redness or pain, please apply mupirocin cream to affected area twice daily x 7 days.  Avoid tobacco use.  Take tylenol as needed for pain.
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
## 2023-06-28 ENCOUNTER — Telehealth: Payer: Self-pay

## 2023-06-28 ENCOUNTER — Other Ambulatory Visit (HOSPITAL_COMMUNITY): Payer: Self-pay

## 2023-06-28 NOTE — Telephone Encounter (Signed)
Pharmacy Patient Advocate Encounter   Received notification from [facility] that prior authorization for Nurtec 75MG  dispersible tablets is required/requested.   PA submitted to [facility] via CoverMyMeds Key or (Medicaid) confirmation # [id number]  Status is pending

## 2023-07-14 NOTE — Telephone Encounter (Signed)
Pharmacy Patient Advocate Encounter  Received notification from [facility] that Prior Authorization for  Nurtec 75MG  dispersible  has been DENIED because see below..    PA #/Case ID/Reference #: PA Case ID: [id number]   Please be advised we currently do not have a Pharmacist to review denials, therefore you will need to process appeals accordingly as needed. Thanks for your support at this time. Contact for appeals are as follows: Phone: [phone], Fax: [phone]

## 2023-09-18 ENCOUNTER — Encounter: Payer: Self-pay | Admitting: Internal Medicine

## 2023-09-18 ENCOUNTER — Ambulatory Visit: Payer: Commercial Managed Care - PPO | Admitting: Internal Medicine

## 2023-09-18 VITALS — BP 145/94 | HR 79 | Temp 98.7°F | Ht 68.0 in | Wt 179.4 lb

## 2023-09-18 DIAGNOSIS — R030 Elevated blood-pressure reading, without diagnosis of hypertension: Secondary | ICD-10-CM | POA: Diagnosis not present

## 2023-09-18 DIAGNOSIS — R0789 Other chest pain: Secondary | ICD-10-CM | POA: Diagnosis not present

## 2023-09-18 DIAGNOSIS — F17210 Nicotine dependence, cigarettes, uncomplicated: Secondary | ICD-10-CM | POA: Diagnosis not present

## 2023-09-18 DIAGNOSIS — K130 Diseases of lips: Secondary | ICD-10-CM

## 2023-09-18 DIAGNOSIS — R0781 Pleurodynia: Secondary | ICD-10-CM

## 2023-09-18 DIAGNOSIS — F17200 Nicotine dependence, unspecified, uncomplicated: Secondary | ICD-10-CM

## 2023-09-18 NOTE — Progress Notes (Signed)
[facility]: [phone]   -- Medical Office Visit --  Patient:  Gabriel Petty      Age: 42 y.o.       Sex:  male  Date:   09/18/2023 Patient Care Team: Lula Olszewski, MD as PCP - General (Internal Medicine) Today's Healthcare Provider: Lula Olszewski, MD     Assessment & Plan Chest wall pain He has been experiencing left-sided chest pain for 3-4 days, exacerbated by coughing, deep breathing, and certain movements. The pain, localized and relieved by pressure, suggests a musculoskeletal origin rather than a cardiovascular etiology. Despite being a smoker, which increases his risk for cardiovascular disease, the clinical picture does not suggest acute coronary syndrome. We will perform an EKG for reassurance and to establish a baseline, and advise over-the-counter ain relief with ibuprofen. Elevated blood pressure reading Encouraged patient to soon follow up for wellness visit and recheck Pleuritic chest pain  Smoker He is a long-term smoker, currently smoking approximately 11 cigarettes per day. We will encourage smoking cessation for overall health improvement.  He declined smoking cessation counseling today  Traumatic lip pain He recently experienced trauma to the lip from a falling box, causing swelling. We advise continued monitoring and to seek medical attention if symptoms worsen or do not improve. Large swellling right lower lip.  General Health Maintenance We will discuss the need for colon cancer screening at a future appointment and consider the Cologuard test.  We strongly encouraged patient to make wellness visit.      Diagnoses and all orders for this visit: Chest wall pain -     Lipid panel -     TSH -     Comprehensive metabolic panel -     CBC with Differential/Platelet -     Troponin I -     D-dimer, quantitative -     EKG 12-Lead Elevated blood pressure reading -     Lipid panel -     TSH -     Comprehensive metabolic panel -      CBC with Differential/Platelet -     Troponin I -     D-dimer, quantitative -     EKG 12-Lead Pleuritic chest pain -     Troponin I -     D-dimer, quantitative -     EKG 12-Lead Smoker -     Troponin I -     D-dimer, quantitative -     EKG 12-Lead Traumatic lip pain  Recommended follow-up: soonest convenience wellness visit. Future Appointments  Date Time Provider Department Center  01/22/2024  2:00 PM [name], NP [facility] None          Subjective   42 y.o. male who has Shortness of breath; History of COVID-19; and Chest wall pain on their problem list. His reasons/main concerns/chief complaints for today's office visit are New Patient (Initial Visit) and Sharp chest pains (Severe on left side of chest, constant and tender to the touch for the last four days. Has woken him up out of sleep, intensifies with coughing, bending etc.)   ------------------------------------------------------------------------------------------------------------------------ AI-Extracted: Discussed the use of AI scribe software for clinical note transcription with the patient, who gave verbal consent to proceed.  History of Present Illness   The patient, a long-term smoker with a 30-year history, presented with persistent chest pain that began three to four days prior to the consultation. The pain, described as sharp, is localized to the left sternal area and has remained  constant since onset. The patient reported that the pain intensifies with coughing, deep breathing, and certain movements, particularly bending or shifting to the left.  The patient was awakened by the pain around 1:30-2:00 AM, which was accompanied by an increase in heart rate and blood pressure, as monitored by his personal device. Despite these symptoms, the patient did not seek immediate medical attention, attributing the pain to musculoskeletal causes rather than cardiac.  The patient leads a sedentary lifestyle due to his job,  which involves sitting in front of a computer for extended periods. However, he engages in regular stretching exercises and maintains a diet that includes meat only twice a week. The patient also reported a recent incident at work where a box fell on him, causing a swollen lip, but it is unclear if this is related to the chest pain.  The patient has a family history of cancer, with multiple relatives on the maternal side having succumbed to various forms of the disease. However, there is no known history of blood clotting disorders in the family. The patient denied experiencing any associated symptoms such as cold sweats, nausea, vomiting, or swelling in the arms or legs. He did report a brief episode of shortness of breath when the pain first started.      He has a past medical history of Headaches, cluster and Hypertension.  Problem list overviews that were updated at today's visit: No problems updated. Current Outpatient Medications on File Prior to Visit  Medication Sig   amLODipine (NORVASC) 5 MG tablet TAKE 1 TABLET BY MOUTH EVERY DAY   Rimegepant Sulfate (NURTEC) 75 MG TBDP Take 1 tablet (75 mg total) by mouth every other day. For migraine prevention   [DISCONTINUED] cetirizine (ZYRTEC) 10 MG tablet Take 10 mg by mouth daily.   [DISCONTINUED] diphenhydramine-acetaminophen (TYLENOL PM) 25-500 MG TABS tablet Take 1 tablet by mouth at bedtime as needed.   No current facility-administered medications on file prior to visit.   Medications Discontinued During This Encounter  Medication Reason   mupirocin cream (BACTROBAN) 2 %      Objective   Physical Exam  BP (!) 145/94 (BP Location: Right Arm, Patient Position: Sitting)   Pulse 79   Temp 98.7 F (37.1 C) (Temporal)   Ht 5\' 8"  (1.727 m)   Wt 179 lb 6.4 oz (81.4 kg)   SpO2 96%   BMI 27.28 kg/m  Wt Readings from Last 10 Encounters:  09/18/23 179 lb 6.4 oz (81.4 kg)  06/02/23 170 lb (77.1 kg)  01/16/23 173 lb (78.5 kg)  10/30/21  166 lb (75.3 kg)  09/27/21 166 lb (75.3 kg)  09/25/21 168 lb 3.2 oz (76.3 kg)  03/06/21 178 lb 9.6 oz (81 kg)  10/07/20 171 lb 6.4 oz (77.7 kg)  08/05/20 170 lb (77.1 kg)  04/18/20 177 lb 12 oz (80.6 kg)   Vital signs reviewed.  Nursing notes reviewed. Weight trend reviewed. Abnormalities and Problem-Specific physical exam findings:   VITALS: P- 70-80 CHEST: Mild tenderness in left lower chest. SKIN: Swelling of the lip due to trauma.    Cardiovascular: heart sounds regular rate and rhythm of s1,s2, no murmurs, rubs, or gallops General Appearance:  No acute distress appreciable.   Well-groomed, healthy-appearing male.  Well proportioned with no abnormal fat distribution.  Good muscle tone. Pulmonary:  Normal work of breathing at rest, no respiratory distress apparent. SpO2: 96 %  Musculoskeletal: All extremities are intact.  Neurological:  Awake, alert, oriented, and engaged.  No  obvious focal neurological deficits or cognitive impairments.  Sensorium seems unclouded.   Speech is clear and coherent with logical content. Psychiatric:  Appropriate mood, pleasant and cooperative demeanor, thoughtful and engaged during the exam  Results      EKG: normal EKG, normal sinus rhythm, there are no previous. Heart rate 62, QTC 417, PR 156. Normal axis. Orders placed or performed in visit on 09/18/23   EKG 12-Lead          No results found for any visits on 09/18/23.  No visits with results within 1 Year(s) from this visit.  Latest known visit with results is:  Office Visit on 10/07/2020  Component Date Value   Vitamin B-12 10/07/2020 622    TSH 10/07/2020 0.939    Hgb A1c MFr Bld 10/07/2020 5.7 (H)    Est. average glucose Bld* 10/07/2020 117    Glucose 10/07/2020 97    BUN 10/07/2020 11    Creatinine, Ser 10/07/2020 1.11    GFR calc non Af Amer 10/07/2020 83    GFR calc Af Amer 10/07/2020 96    BUN/Creatinine Ratio 10/07/2020 10    Sodium 10/07/2020 140    Potassium 10/07/2020 4.4     Chloride 10/07/2020 101    CO2 10/07/2020 23    Calcium 10/07/2020 9.7    Total Protein 10/07/2020 7.3    Albumin 10/07/2020 4.8    Globulin, Total 10/07/2020 2.5    Albumin/Globulin Ratio 10/07/2020 1.9    Bilirubin Total 10/07/2020 0.3    Alkaline Phosphatase 10/07/2020 86    AST 10/07/2020 19    ALT 10/07/2020 14    WBC 10/07/2020 8.3    RBC 10/07/2020 5.40    Hemoglobin 10/07/2020 17.0    Hematocrit 10/07/2020 49.0    MCV 10/07/2020 91    MCH 10/07/2020 31.5    MCHC 10/07/2020 34.7    RDW 10/07/2020 12.9    Platelets 10/07/2020 307    Neutrophils 10/07/2020 67    Lymphs 10/07/2020 26    Monocytes 10/07/2020 6    Eos 10/07/2020 1    Basos 10/07/2020 0    Neutrophils Absolute 10/07/2020 5.5    Lymphocytes Absolute 10/07/2020 2.2    Monocytes Absolute 10/07/2020 0.5    EOS (ABSOLUTE) 10/07/2020 0.1    Basophils Absolute 10/07/2020 0.0    Immature Granulocytes 10/07/2020 0    Immature Grans (Abs) 10/07/2020 0.0    Magnesium 10/07/2020 2.3    No image results found.   No results found.  No results found.     Additional Info: This encounter employed real-time, collaborative documentation. The patient actively reviewed and updated their medical record on a shared screen, ensuring transparency and facilitating joint problem-solving for the problem list, overview, and plan. This approach promotes accurate, informed care. The treatment plan was discussed and reviewed in detail, including medication safety, potential side effects, and all patient questions. We confirmed understanding and comfort with the plan. Follow-up instructions were established, including contacting the office for any concerns, returning if symptoms worsen, persist, or new symptoms develop, and precautions for potential emergency department visits.

## 2023-09-18 NOTE — Assessment & Plan Note (Signed)
He has been experiencing left-sided chest pain for 3-4 days, exacerbated by coughing, deep breathing, and certain movements. The pain, localized and relieved by pressure, suggests a musculoskeletal origin rather than a cardiovascular etiology. Despite being a smoker, which increases his risk for cardiovascular disease, the clinical picture does not suggest acute coronary syndrome. We will perform an EKG for reassurance and to establish a baseline, and advise over-the-counter ain relief with ibuprofen.

## 2023-09-18 NOTE — Patient Instructions (Signed)
VISIT SUMMARY:  During your visit, we discussed your persistent chest pain, your smoking habit, and the recent trauma to your lip. We believe your chest pain is likely due to a muscle or bone issue, not a heart problem. However, we will perform an EKG to be sure. We also discussed the importance of quitting smoking for your overall health. Lastly, we talked about the swelling on your lip due to a recent accident. We advised you to keep an eye on it and seek medical attention if it gets worse.  YOUR PLAN:  -CHEST PAIN: Your chest pain is likely due to a muscle or bone issue, not a heart problem. We will perform an EKG, which is a test that checks how your heart is functioning, to confirm this. You can take over-the-counter pain relief like ibuprofen to help with the pain.  -SMOKING: As a long-term smoker, it's important for your overall health that you quit smoking. We discussed strategies for quitting and will continue to support you in this effort.  -LIP TRAUMA: You recently had an accident that caused your lip to swell. Keep an eye on it and if it gets worse or doesn't improve, seek medical attention.  -GENERAL HEALTH MAINTENANCE: In the future, we will discuss the need for colon cancer screening, which is a test to check for signs of colon cancer. We may consider using the Cologuard test, a non-invasive test that checks for colon cancer.  INSTRUCTIONS:  Please schedule an appointment for an EKG. Continue to monitor your lip and seek medical attention if it worsens or does not improve. We will discuss colon cancer screening at a future appointment.

## 2023-09-19 ENCOUNTER — Encounter: Payer: Self-pay | Admitting: Internal Medicine

## 2023-09-19 LAB — CBC WITH DIFFERENTIAL/PLATELET
Basophils Absolute: 0.1 10*3/uL (ref 0.0–0.1)
Basophils Relative: 1 % (ref 0.0–3.0)
Eosinophils Absolute: 0.2 10*3/uL (ref 0.0–0.7)
Eosinophils Relative: 3 % (ref 0.0–5.0)
HCT: 46.2 % (ref 39.0–52.0)
Hemoglobin: 15.5 g/dL (ref 13.0–17.0)
Lymphocytes Relative: 33.9 % (ref 12.0–46.0)
Lymphs Abs: 2.7 10*3/uL (ref 0.7–4.0)
MCHC: 33.5 g/dL (ref 30.0–36.0)
MCV: 91.9 fl (ref 78.0–100.0)
Monocytes Absolute: 0.4 10*3/uL (ref 0.1–1.0)
Monocytes Relative: 5.1 % (ref 3.0–12.0)
Neutro Abs: 4.5 10*3/uL (ref 1.4–7.7)
Neutrophils Relative %: 57 % (ref 43.0–77.0)
Platelets: 306 10*3/uL (ref 150.0–400.0)
RBC: 5.03 Mil/uL (ref 4.22–5.81)
RDW: 13.5 % (ref 11.5–15.5)
WBC: 7.9 10*3/uL (ref 4.0–10.5)

## 2023-09-19 LAB — LIPID PANEL
Cholesterol: 177 mg/dL (ref 0–200)
HDL: 29.3 mg/dL — ABNORMAL LOW (ref >39.00)
LDL Cholesterol: 120 mg/dL — ABNORMAL HIGH (ref 0–99)
NonHDL: 147.69
Total CHOL/HDL Ratio: 6
Triglycerides: 136 mg/dL (ref 0.0–149.0)
VLDL: 27.2 mg/dL (ref 0.0–40.0)

## 2023-09-19 LAB — COMPREHENSIVE METABOLIC PANEL
ALT: 13 U/L (ref 0–53)
AST: 21 U/L (ref 0–37)
Albumin: 4.2 g/dL (ref 3.5–5.2)
Alkaline Phosphatase: 65 U/L (ref 39–117)
BUN: 5 mg/dL — ABNORMAL LOW (ref 6–23)
CO2: 27 mEq/L (ref 19–32)
Calcium: 9.2 mg/dL (ref 8.4–10.5)
Chloride: 104 mEq/L (ref 96–112)
Creatinine, Ser: 1.09 mg/dL (ref 0.40–1.50)
GFR: 83.61 mL/min (ref >60.00)
Glucose, Bld: 98 mg/dL (ref 70–99)
Potassium: 4 mEq/L (ref 3.5–5.1)
Sodium: 139 mEq/L (ref 135–145)
Total Bilirubin: 0.5 mg/dL (ref 0.2–1.2)
Total Protein: 7 g/dL (ref 6.0–8.3)

## 2023-09-19 LAB — D-DIMER, QUANTITATIVE: D-Dimer, Quant: 0.72 mcg/mL FEU — ABNORMAL HIGH (ref <0.50)

## 2023-09-19 LAB — TROPONIN I: Troponin I: <3 ng/L (ref <=47)

## 2023-09-19 LAB — TSH: TSH: 0.88 u[IU]/mL (ref 0.35–5.50)

## 2023-09-20 ENCOUNTER — Encounter: Payer: Self-pay | Admitting: Internal Medicine

## 2023-09-20 DIAGNOSIS — R030 Elevated blood-pressure reading, without diagnosis of hypertension: Secondary | ICD-10-CM | POA: Insufficient documentation

## 2023-09-20 DIAGNOSIS — E785 Hyperlipidemia, unspecified: Secondary | ICD-10-CM | POA: Insufficient documentation

## 2023-09-20 DIAGNOSIS — F17200 Nicotine dependence, unspecified, uncomplicated: Secondary | ICD-10-CM | POA: Insufficient documentation

## 2023-09-20 DIAGNOSIS — I10 Essential (primary) hypertension: Secondary | ICD-10-CM | POA: Insufficient documentation

## 2023-09-24 NOTE — Telephone Encounter (Signed)
Informed patient of lab results/notes. Scheduled to come in on 10/9 for a follow-up visit.

## 2023-10-02 ENCOUNTER — Other Ambulatory Visit (HOSPITAL_BASED_OUTPATIENT_CLINIC_OR_DEPARTMENT_OTHER): Payer: Self-pay

## 2023-10-02 ENCOUNTER — Encounter: Payer: Self-pay | Admitting: Internal Medicine

## 2023-10-02 ENCOUNTER — Ambulatory Visit (INDEPENDENT_AMBULATORY_CARE_PROVIDER_SITE_OTHER): Payer: Commercial Managed Care - PPO | Admitting: Internal Medicine

## 2023-10-02 ENCOUNTER — Other Ambulatory Visit: Payer: Self-pay

## 2023-10-02 VITALS — BP 125/85 | HR 91 | Temp 98.6°F | Ht 68.0 in | Wt 178.4 lb

## 2023-10-02 DIAGNOSIS — Z981 Arthrodesis status: Secondary | ICD-10-CM

## 2023-10-02 DIAGNOSIS — Z809 Family history of malignant neoplasm, unspecified: Secondary | ICD-10-CM

## 2023-10-02 DIAGNOSIS — Z0000 Encounter for general adult medical examination without abnormal findings: Secondary | ICD-10-CM | POA: Diagnosis not present

## 2023-10-02 DIAGNOSIS — E785 Hyperlipidemia, unspecified: Secondary | ICD-10-CM

## 2023-10-02 DIAGNOSIS — F17299 Nicotine dependence, other tobacco product, with unspecified nicotine-induced disorders: Secondary | ICD-10-CM

## 2023-10-02 DIAGNOSIS — F17200 Nicotine dependence, unspecified, uncomplicated: Secondary | ICD-10-CM

## 2023-10-02 DIAGNOSIS — I10 Essential (primary) hypertension: Secondary | ICD-10-CM

## 2023-10-02 DIAGNOSIS — Z0001 Encounter for general adult medical examination with abnormal findings: Secondary | ICD-10-CM

## 2023-10-02 MED ORDER — NICOTINE POLACRILEX 4 MG MT LOZG
LOZENGE | OROMUCOSAL | 0 refills | Status: DC
  Filled 2023-10-02: qty 126, fill #0
  Filled 2023-10-15: qty 144, fill #0
  Filled 2023-10-22: qty 72, 4d supply, fill #0
  Filled 2023-12-16: qty 72, 4d supply, fill #1

## 2023-10-02 MED ORDER — NICOTINE POLACRILEX 4 MG MT GUM
CHEWING_GUM | OROMUCOSAL | 0 refills | Status: DC
  Filled 2023-10-02: qty 252, fill #0
  Filled 2023-12-16: qty 220, 11d supply, fill #0

## 2023-10-02 MED ORDER — NICOTINE POLACRILEX 4 MG MT LOZG
LOZENGE | OROMUCOSAL | 0 refills | Status: DC
  Filled 2023-10-02: qty 252, fill #0
  Filled 2023-12-16: qty 288, 15d supply, fill #0

## 2023-10-02 MED ORDER — ROSUVASTATIN CALCIUM 5 MG PO TABS
5.0000 mg | ORAL_TABLET | Freq: Every day | ORAL | 3 refills | Status: DC
  Filled 2023-10-02: qty 90, 90d supply, fill #0

## 2023-10-02 MED ORDER — NICOTINE POLACRILEX 4 MG MT GUM
CHEWING_GUM | OROMUCOSAL | 0 refills | Status: DC
  Filled 2023-10-02: qty 100, 5d supply, fill #0

## 2023-10-02 MED ORDER — NICOTINE POLACRILEX 4 MG MT GUM
CHEWING_GUM | OROMUCOSAL | 0 refills | Status: AC
  Filled 2023-10-02: qty 126, fill #0
  Filled 2023-10-15: qty 100, 15d supply, fill #0
  Filled 2023-10-22: qty 100, 5d supply, fill #0
  Filled 2023-12-16: qty 100, 5d supply, fill #1

## 2023-10-02 MED ORDER — NICOTINE POLACRILEX 4 MG MT LOZG
LOZENGE | OROMUCOSAL | 0 refills | Status: DC
  Filled 2023-10-02: qty 1008, fill #0
  Filled 2023-10-03: qty 72, 5d supply, fill #0

## 2023-10-02 NOTE — Progress Notes (Signed)
[facility]: [phone]   -- Annual Preventive Medical Office Visit --  Patient:  Gabriel Petty      Age: 42 y.o.       Sex:  male  Date:   10/02/2023 Patient Care Team: Lula Olszewski, MD as PCP - General (Internal Medicine) Today's Healthcare Provider: Lula Olszewski, MD   Chief Complaint  Patient presents with   Annual Exam    Purpose of Visit: Comprehensive preventive health assessment and personalized health maintenance planning.  This encounter was conducted as a Comprehensive Physical Exam (CPE) preventive care annual visit. The patient's medical history and problem list were reviewed to inform individualized preventive care recommendations. No problem-specific medical treatment was provided during this visit.  Assessment & Plan Encounter for annual health examination  Other tobacco product nicotine dependence with nicotine-induced disorder  Dyslipidemia The 10-year ASCVD risk score (Arnett DK, et al., 2019) is: 10.6%   Values used to calculate the score:     Age: 69 years     Sex: Male     Is Non-Hispanic African American: Yes     Diabetic: No     Tobacco smoker: Yes     Systolic Blood Pressure: 125 mmHg     Is BP treated: Yes     HDL Cholesterol: 29.3 mg/dL     Total Cholesterol: 177 mg/dL  Calculated in front of patient and explained meaning. Advised patient start statin low dose and diet change and he agreed. He has elevated cholesterol levels with a calculated 10-year cardiovascular risk of 10.26%. We discussed the benefits and potential side effects of statin therapy. We will initiate low-dose statin therapy and encourage dietary modifications including increased intake of fish, avocados, and extra virgin olive oil. Encounter for annual general medical examination with abnormal findings in adult  Preventative health care He is advised to continue regular eye and dental exams, consider colon cancer screening at age 82, and prostate  cancer screening to be considered at age 76 unless symptoms or family history warrant earlier screening. We encourage moderate exercise and a balanced diet for overall health maintenance.  Smoker He is a long-term heavy smoker, currently smoking half a pack per day. We discussed the risks associated with smoking and offered nicotine replacement therapy (gum and lozenges) to aid in reduction and eventual cessation. We will initiate nicotine replacement therapy with gum and lozenges and consider lung cancer screening given his smoking history. Hypertension, unspecified type Hypertension is controlled with Amlodipine, and his blood pressure readings at home are within the normal range. He is advised to continue monitoring his blood pressure and to limit salt intake, especially high sodium seasonings. We will continue Amlodipine. Status post cervical spinal fusion He reports no current issues with balance or strength and experiences occasional tingling in his hands, which resolves with increased hydration. There are no signs of urinary or bowel incontinence. We will continue his current management. FH: cancer He has noted multiple family members with various cancers. He is advised to perform self-breast exams due to his family history of breast cancer. He should continue regular self-breast exams.  Diagnoses and all orders for this visit: Encounter for annual health examination Other tobacco product nicotine dependence with nicotine-induced disorder -     nicotine polacrilex (NICORETTE) 4 MG gum; RX #1 Weeks 1-6: 1 piece every 1-2 hours.Use at least 9 pieces of gum per day for the first 6 weeks. Max 24 pieces per day. -     nicotine  polacrilex (NICORETTE) 4 MG gum; RX #2 Weeks 7-9: 1 piece every 2-4 hours.Max 24 pieces per day.. -     nicotine polacrilex (NICORETTE) 4 MG gum; RX #3 Weeks 10-12: 1 piece every 4-8 hours.Max 24 pieces per day. -     nicotine polacrilex (COMMIT) 4 MG lozenge; RX #1 Weeks  1-6: 1 lozenge every 1-2 hours. Use at least 9 lozenges per day for the first 6 weeks. Max 20 lozenges per day. -     nicotine polacrilex (COMMIT) 4 MG lozenge; RX #2 Weeks 7-9: 1 lozenge every 2-4 hours.Max 20 lozenges per day. -     nicotine polacrilex (COMMIT) 4 MG lozenge; RX #3 Weeks 10-12: 1 lozenge every 4-8 hours.Max 20 lozenges per day. -     Low Dose CT Chest w/o Contrast for Lung Cancer Screening [[id number]]; Future Dyslipidemia -     rosuvastatin (CRESTOR) 5 MG tablet; Take 1 tablet (5 mg total) by mouth daily. Encounter for annual general medical examination with abnormal findings in adult Preventative health care Smoker  Today's Health Maintenance Counseling and Anticipatory Guidance:  Eye exams:  We encouraged patient to to complete eye exam every 1-2 years.  He reports his last eye exam was:  last year  Dental health:  He reports he has been keeping up with his dental visits.  He intends to do so in the future.  We encourage regular tooth brushing/flossing. Sinus health:  We encouraged sterile saline nasal misting sinus rinses daily for pollen, to reduce allergies and risk for sinus infections.   Sterile can based misting products are recommended due to superior misting and ease of maintaining sterility Sleep Apnea screening:  We encourage self monitoring of sleep quality with SnoreLab App and other tools/apps that are now available at retail STOP-Bang Score (scored NO unless checked) []  Do you snore loudly []  Tired, fatigued, or sleepy during the daytime []  Witness apneas []  Significant hypertension []  BMI greater than 35    []  Age older than 42 years old []  Has large neck size over 15.7 in [x]  Male Total Score: 1  Cardiovascular Risk Factor Reduction:   Advised patient of need for regular exercise and diet rich and fruits and vegetables and healthy fats to reduce risk of heart attack and stroke.  Avoid first- and second-hand smoke and stimulants.   Avoid extreme exercise-  exercise in moderation (150 minutes per week is a good goal) Wt Readings from Last 3 Encounters:  10/02/23 178 lb 6.4 oz (80.9 kg)  09/18/23 179 lb 6.4 oz (81.4 kg)  06/02/23 170 lb (77.1 kg)  Body mass index is 27.13 kg/m. He reports his diet consists of PRETTY GOOD He reports his exercise includes of LOT OF WORK Health maintenance and immunizations reviewed and he was encouraged to complete anything that is due: Immunization History  Administered Date(s) Administered   Tdap 01/31/2017   There are no preventive care reminders to display for this patient.  Sexual transmitted infection screening: testing offered today, but patient declined as he feels he is low risk based on his sexual history.   Social History   Substance and Sexual Activity  Sexual Activity Yes   Birth control/protection: None   Comment: Wife had Tubial ligation     Substance use:  I discussed that my recommendation is total abstinence from all substances of abuse including smoke and 2nd hand smoke, alcohol, illicit drugs, smoking, inhalants, sugar.   Offered to assist with any use disorders or addictions.  Injury prevention: Discussed safety belts, safety helmets, smoke detectors. Cancer Screening: Penile/Testicle/Scrotum cancer screening: Asked the patient about genital warts or tumors/abnormalities of penis/testicles/scrotum,  Thyroid cancer screening: patient advised to check by palpating thyroid for nodules Prostate cancer screening:  Denies family history of prostate cancer or hematospermia so too young for screening by current guidelines.(No results found for: "PSA") Colon cancer screening:   Denies strong family history of colon cancer or blood in stool so no screening is indicated until age 48.   Lung cancer screening:  Current guidelines recommend Individuals aged 28 to 70 who currently smoke or formerly smoked and have a >= 20 pack-year smoking history should undergo annual screening with low-dose computed  tomography (LDCT). HE ISN'T 50 BUT HE otherwise QUALIFIES, CONSIDERING out of pocket we will order and see Skin cancer screening-  Advised regular sunscreen use. He denies worrisome, changing, or new skin lesions. Showed him pictures of melanomas for reference:   Return to care in 1 year for next preventative visit.   Subjective  AI-Extracted: Discussed the use of AI scribe software for clinical note transcription with the patient, who gave verbal consent to proceed.  History of Present Illness   The patient, a 42 year old with a history of hypertension and spinal fusion surgery, presented for an annual exam. The patient reported taking elodipine for hypertension, which is monitored daily at home by his spouse. The readings are typically around 130/125 mmHg.  The patient underwent spinal fusion surgery (C5, C6, C7) in 2021 due to tingling in his hands and feet and episodes of sudden loss of balance. Post-surgery, the patient reported improvement, with occasional tingling in his hands.  The patient has a significant smoking history, having smoked cigarettes for approximately 30 years. In the past 15 years, the patient reported smoking a pack a day, but has recently reduced this to half a pack a day following his spinal fusion surgery. The patient also reported a long history of cannabis use.  The patient's family history is notable for various cancers. His grandfather died of small cell lung cancer, an aunt died of brain cancer, and several of his grandmother's sisters died of breast cancer. The patient reported that some of these relatives were heavy smokers and drinkers.  The patient's diet consists mainly of vegetables, with meat consumed once or twice a week. He also reported using extra virgin olive oil in his cooking and occasionally eating white fish. The patient's work involves physical activity, moving cases throughout the day.  The patient reported no issues with sleep apnea, no blood in  his stool or semen, and no lumps or bumps in his groin area. He also reported no new or growing skin spots. The patient's cholesterol levels were noted to be suboptimal during his last lab work.      Review of Systems  Constitutional:  Negative for chills, diaphoresis, fever, malaise/fatigue and weight loss.  HENT:  Negative for congestion, ear discharge, ear pain, hearing loss, nosebleeds, sinus pain, sore throat and tinnitus.   Eyes:  Negative for blurred vision, double vision, photophobia, pain, discharge and redness.  Respiratory:  Negative for cough, hemoptysis, sputum production, shortness of breath, wheezing and stridor.   Cardiovascular:  Negative for chest pain, palpitations, orthopnea, claudication, leg swelling and PND.  Gastrointestinal:  Negative for abdominal pain, blood in stool, constipation, diarrhea, heartburn, melena, nausea and vomiting.  Genitourinary:  Negative for dysuria, flank pain, frequency, hematuria and urgency.  Musculoskeletal:  Negative for back pain, falls, joint  pain, myalgias and neck pain.  Skin:  Negative for itching and rash.  Neurological:  Negative for dizziness, tingling, tremors, sensory change, speech change, focal weakness, seizures, loss of consciousness, weakness and headaches.  Endo/Heme/Allergies:  Negative for environmental allergies and polydipsia. Does not bruise/bleed easily.  Psychiatric/Behavioral:  Negative for depression, hallucinations, memory loss, substance abuse and suicidal ideas. The patient is not nervous/anxious and does not have insomnia.      Disclaimer about ROS at Annual Preventive Visits Patients are informed before the Review of Systems (ROS) that identifying significant medical issues during the wellness visit may require immediate attention, potentially resulting in a separate billable encounter beyond the scope of the preventive exam. This disclosure is mandated by professional ethics and legal obligations, as healthcare  providers must address any substantial health concerns raised during any patient interaction. A comprehensive ROS is required by insurance companies for billing the visit. However, this structure may inadvertently discourage patients from fully disclosing health concerns due to potential financial implications. Consequently, patients often emphasize that any positive ROS findings are related to stable chronic conditions, requesting that these not be discussed during the preventive visit to avoid additional charges. Patients may also ask that reported complaints not be listed in the ROS to prevent affecting billing.  Problem list overviews that were updated at today's visit: No problems updated. I attest that I have reviewed and confirmed the patients current medications to meet the medication reconciliation requirement  Current Outpatient Medications on File Prior to Visit  Medication Sig   amLODipine (NORVASC) 5 MG tablet TAKE 1 TABLET BY MOUTH EVERY DAY   Rimegepant Sulfate (NURTEC) 75 MG TBDP Take 1 tablet (75 mg total) by mouth every other day. For migraine prevention   [DISCONTINUED] cetirizine (ZYRTEC) 10 MG tablet Take 10 mg by mouth daily.   [DISCONTINUED] diphenhydramine-acetaminophen (TYLENOL PM) 25-500 MG TABS tablet Take 1 tablet by mouth at bedtime as needed.   No current facility-administered medications on file prior to visit.  There are no discontinued medications. The following were reviewed and entered/updated into our electronic MEDICAL RECORD NUMBER   09/18/2023    3:32 PM  Depression screen PHQ 2/9  Decreased Interest 0  Down, Depressed, Hopeless 0  PHQ - 2 Score 0   Past Medical History:  Diagnosis Date   Headaches, cluster    Hypertension    Patient Active Problem List   Diagnosis Date Noted   Dyslipidemia 09/20/2023   Hypertension 09/20/2023   Smoker 09/20/2023   Shortness of breath 03/15/2020   History of COVID-19 03/15/2020   Chest wall pain 03/15/2020   Past  Surgical History:  Procedure Laterality Date   arm surgery  2003   plate, screws R arm   CERVICAL FUSION  11/15/2020   C5-7   SPINE SURGERY  2021   Family History  Problem Relation Age of Onset   Hypertension Mother    Diabetes Maternal Grandmother    Thyroid disease Maternal Grandfather    Cancer Maternal Grandfather    Cancer Maternal Aunt    No Known Allergies Social History   Tobacco Use   Smoking status: Every Day    Current packs/day: 1.00    Average packs/day: 0.9 packs/day for 25.0 years (22.8 ttl pk-yrs)    Types: Cigarettes   Smokeless tobacco: Never   Tobacco comments:    09/25/21 4 a day  Vaping Use   Vaping status: Never Used  Substance Use Topics   Alcohol use: No  Drug use: Yes    Types: Marijuana     Objective  BP 125/85 (BP Location: Right Arm, Patient Position: Sitting)   Pulse 91   Temp 98.6 F (37 C) (Temporal)   Ht 5\' 8"  (1.727 m)   Wt 178 lb 6.4 oz (80.9 kg)   SpO2 98%   BMI 27.13 kg/m   Body mass index is 27.13 kg/m. Wt Readings from Last 3 Encounters:  10/02/23 178 lb 6.4 oz (80.9 kg)  09/18/23 179 lb 6.4 oz (81.4 kg)  06/02/23 170 lb (77.1 kg)  Physical Exam   VITALS: BP- 125/? HEENT: Teeth appear in good condition, mild nasal mucosa irritation suggested. Sinus rinse recommended. NECK: Thyroid gland normal size, no nodules palpated. CARDIOVASCULAR: Heart sounds normal. BREAST: No lumps palpated.      GEN: NAD, Resting Comfortably. HEENT: Tympanic membranes normal appearing bilaterally, Oropharynx clear, No Thyromegaly noted. No palpable lymphadenopathy or thyroid nodules. CARDIOVASCULAR: S1 and S2 heart sounds have regular rate and rhythm with no murmurs appreciated. PULMONARY:  Normal work of breathing. Clear to auscultation bilaterally with no crackles, wheezes, or rhonchi. ABDOMEN: Soft, Nontender, Nondistended.  MSK: No edema, cyanosis, or clubbing noted. SKIN: Warm, dry, no lesions of concern observed. NEURO: CN2-12  grossly intact. Strength 5/5 in upper and lower extremities. Reflexes symmetric and intact bilaterally.  PSYCH: Normal affect and thought content, pleasant and cooperative.

## 2023-10-02 NOTE — Assessment & Plan Note (Signed)
He is a long-term heavy smoker, currently smoking half a pack per day. We discussed the risks associated with smoking and offered nicotine replacement therapy (gum and lozenges) to aid in reduction and eventual cessation. We will initiate nicotine replacement therapy with gum and lozenges and consider lung cancer screening given his smoking history.

## 2023-10-02 NOTE — Assessment & Plan Note (Signed)
The 10-year ASCVD risk score (Arnett DK, et al., 2019) is: 10.6%   Values used to calculate the score:     Age: 42 years     Sex: Male     Is Non-Hispanic African American: Yes     Diabetic: No     Tobacco smoker: Yes     Systolic Blood Pressure: 125 mmHg     Is BP treated: Yes     HDL Cholesterol: 29.3 mg/dL     Total Cholesterol: 177 mg/dL  Calculated in front of patient and explained meaning. Advised patient start statin low dose and diet change and he agreed. He has elevated cholesterol levels with a calculated 10-year cardiovascular risk of 10.26%. We discussed the benefits and potential side effects of statin therapy. We will initiate low-dose statin therapy and encourage dietary modifications including increased intake of fish, avocados, and extra virgin olive oil.

## 2023-10-02 NOTE — Assessment & Plan Note (Signed)
Hypertension is controlled with Amlodipine, and his blood pressure readings at home are within the normal range. He is advised to continue monitoring his blood pressure and to limit salt intake, especially high sodium seasonings. We will continue Amlodipine.

## 2023-10-02 NOTE — Patient Instructions (Signed)
VISIT SUMMARY:  During your annual exam, we discussed your hypertension, post-spinal fusion surgery status, tobacco use, family history of cancer, and elevated cholesterol levels. We also talked about your general health maintenance.  YOUR PLAN:  -HYPERTENSION: Your blood pressure is well-controlled with Amlodipine. Continue taking this medication and monitoring your blood pressure at home. Try to limit your salt intake, especially high sodium seasonings.  -CERVICAL SPINAL FUSION (C5-C7): You're doing well after your spinal fusion surgery. Continue your current management and remember to stay hydrated to help with the occasional tingling in your hands.  -TOBACCO USE: Given your long history of smoking, we discussed the risks and offered nicotine replacement therapy (gum and lozenges) to help you reduce and eventually quit smoking. We'll start this therapy and consider lung cancer screening due to your smoking history.  -FAMILY HISTORY OF CANCER: Due to your family history of various cancers, it's important for you to perform regular self-breast exams. Continue doing these exams regularly.  -HYPERLIPIDEMIA: Your cholesterol levels are a bit high, which increases your risk of heart disease. We'll start you on a low-dose statin medication and recommend dietary changes like eating more fish, avocados, and extra virgin olive oil.  -GENERAL HEALTH MAINTENANCE: For your overall health, continue regular eye and dental exams. Consider colon cancer screening at age 69 and prostate cancer screening at age 46, unless symptoms or family history suggest earlier screening. Maintain a balanced diet and moderate exercise.  INSTRUCTIONS:  Please start the nicotine replacement therapy and low-dose statin medication as discussed. Continue monitoring your blood pressure at home and performing regular self-breast exams. Remember to stay hydrated and make the recommended dietary changes. Schedule your regular eye and  dental exams, and consider colon cancer screening at age 63 and prostate cancer screening at age 7.

## 2023-10-03 ENCOUNTER — Other Ambulatory Visit (HOSPITAL_BASED_OUTPATIENT_CLINIC_OR_DEPARTMENT_OTHER): Payer: Self-pay

## 2023-10-03 ENCOUNTER — Encounter: Payer: Self-pay | Admitting: Internal Medicine

## 2023-10-04 ENCOUNTER — Other Ambulatory Visit (HOSPITAL_BASED_OUTPATIENT_CLINIC_OR_DEPARTMENT_OTHER): Payer: Self-pay

## 2023-10-15 ENCOUNTER — Ambulatory Visit
Admission: RE | Admit: 2023-10-15 | Discharge: 2023-10-15 | Disposition: A | Discharge status: Home / Self Care | Payer: Commercial Managed Care - PPO | Source: Ambulatory Visit | Attending: Internal Medicine | Admitting: Internal Medicine

## 2023-10-15 DIAGNOSIS — F17299 Nicotine dependence, other tobacco product, with unspecified nicotine-induced disorders: Secondary | ICD-10-CM

## 2023-10-16 ENCOUNTER — Other Ambulatory Visit (HOSPITAL_BASED_OUTPATIENT_CLINIC_OR_DEPARTMENT_OTHER): Payer: Self-pay

## 2023-10-17 ENCOUNTER — Other Ambulatory Visit (HOSPITAL_BASED_OUTPATIENT_CLINIC_OR_DEPARTMENT_OTHER): Payer: Self-pay

## 2023-10-22 ENCOUNTER — Other Ambulatory Visit: Payer: Self-pay

## 2023-10-22 ENCOUNTER — Other Ambulatory Visit (HOSPITAL_BASED_OUTPATIENT_CLINIC_OR_DEPARTMENT_OTHER): Payer: Self-pay

## 2023-10-24 ENCOUNTER — Telehealth: Payer: Self-pay | Admitting: Internal Medicine

## 2023-10-24 NOTE — Telephone Encounter (Signed)
Pt would like a call back with lab results 

## 2023-10-24 NOTE — Telephone Encounter (Signed)
Ppt called back and needs a call back asap for imaging results.

## 2023-10-24 NOTE — Telephone Encounter (Signed)
Called patient concerning this, informed him that the imaging has been resulted by the Radiologist but has not been reviewed by Dr. Jon Billings yet. Messaged Dr. Janie Morning and requested for him to review the imaging. Patient was calling about imaging and not labs.

## 2023-10-25 ENCOUNTER — Encounter: Payer: Self-pay | Admitting: Internal Medicine

## 2023-10-25 DIAGNOSIS — I2510 Atherosclerotic heart disease of native coronary artery without angina pectoris: Secondary | ICD-10-CM | POA: Insufficient documentation

## 2023-10-25 DIAGNOSIS — J449 Chronic obstructive pulmonary disease, unspecified: Secondary | ICD-10-CM | POA: Insufficient documentation

## 2023-10-25 NOTE — Progress Notes (Signed)
Reviewed CT chest (10/24/2023) and labs (09/18/2023). CT shows Lung-RADS 2S with benign nodule, mild emphysema, and moderate coronary calcifications. Labs show dyslipidemia (HDL 29.30, LDL 120) with elevated cardiac risk factors. Normal troponin, mildly elevated D-dimer. Plan: continue annual lung screening, optimize cardiac risk factors, smoking cessation support. Detailed explanation provided in Patient Message.

## 2023-10-25 NOTE — Telephone Encounter (Signed)
Dr. Jon Billings has reviewed imaging, called patient and informed him.

## 2023-10-31 ENCOUNTER — Encounter: Payer: Self-pay | Admitting: Internal Medicine

## 2023-10-31 ENCOUNTER — Other Ambulatory Visit (HOSPITAL_BASED_OUTPATIENT_CLINIC_OR_DEPARTMENT_OTHER): Payer: Self-pay

## 2023-10-31 ENCOUNTER — Ambulatory Visit: Payer: Commercial Managed Care - PPO | Admitting: Internal Medicine

## 2023-10-31 VITALS — BP 143/101 | HR 93 | Temp 98.6°F | Ht 68.0 in | Wt 179.8 lb

## 2023-10-31 DIAGNOSIS — Z8042 Family history of malignant neoplasm of prostate: Secondary | ICD-10-CM | POA: Diagnosis not present

## 2023-10-31 DIAGNOSIS — R911 Solitary pulmonary nodule: Secondary | ICD-10-CM

## 2023-10-31 DIAGNOSIS — E785 Hyperlipidemia, unspecified: Secondary | ICD-10-CM

## 2023-10-31 DIAGNOSIS — R0789 Other chest pain: Secondary | ICD-10-CM

## 2023-10-31 DIAGNOSIS — I2584 Coronary atherosclerosis due to calcified coronary lesion: Secondary | ICD-10-CM

## 2023-10-31 DIAGNOSIS — J438 Other emphysema: Secondary | ICD-10-CM | POA: Diagnosis not present

## 2023-10-31 DIAGNOSIS — I2510 Atherosclerotic heart disease of native coronary artery without angina pectoris: Secondary | ICD-10-CM

## 2023-10-31 DIAGNOSIS — J439 Emphysema, unspecified: Secondary | ICD-10-CM | POA: Insufficient documentation

## 2023-10-31 DIAGNOSIS — I10 Essential (primary) hypertension: Secondary | ICD-10-CM

## 2023-10-31 DIAGNOSIS — F17200 Nicotine dependence, unspecified, uncomplicated: Secondary | ICD-10-CM

## 2023-10-31 MED ORDER — LOSARTAN POTASSIUM 25 MG PO TABS
25.0000 mg | ORAL_TABLET | Freq: Every day | ORAL | 3 refills | Status: AC
  Filled 2023-10-31 – 2023-11-26 (×2): qty 90, 90d supply, fill #0
  Filled 2023-12-16: qty 90, 90d supply, fill #1

## 2023-10-31 MED ORDER — ASPIRIN 81 MG PO TBEC
81.0000 mg | DELAYED_RELEASE_TABLET | Freq: Every day | ORAL | 3 refills | Status: AC
Start: 2023-10-31 — End: ?

## 2023-10-31 NOTE — Assessment & Plan Note (Signed)
At a young age, noted on LDCT, associated with dyslipidemia/smoking. He is taking cholesterol medication(s), will start aspirin,

## 2023-10-31 NOTE — Assessment & Plan Note (Signed)
He presents with severe hypertension, BP 210/120, and non-compliance with amlodipine 5 mg. The benefits of losartan 25 mg and the importance of home BP monitoring were discussed. Losartan 25 mg will be prescribed, and he is encouraged to monitor his blood pressure at home. A follow-up in 1-2 months for a BP check and medication adjustment is scheduled.

## 2023-10-31 NOTE — Assessment & Plan Note (Signed)
He has very low HDL cholesterol, likely genetic, and slightly elevated LDL at 120, increasing cardiovascular risk due to smoking and low HDL. Starting low-dose cholesterol medication with a gradual increase if tolerated is recommended, and he is encouraged to cease smoking.

## 2023-10-31 NOTE — Progress Notes (Signed)
[facility]: [phone]   -- Medical Office Visit --  Patient:  Gabriel Petty      Age: 42 y.o.       Sex:  male  Date:   10/31/2023 Today's Healthcare Provider: Lula Olszewski, MD  ==========================================================================     Assessment & Plan FH: prostate cancer The patient, a current smoker with a family history of prostate cancer, presents with concerns about chest pain and shortness of breath. The patient reports a significant reduction in smoking, from half a pack per day to only a few cigarettes daily. The patient's chest pain, which was initially severe, has since resolved. The patient attributes this to a possible muscle strain due to physical work, but acknowledges the potential risk of heart disease due to smoking. The patient also reports occasional shortness of breath, particularly during strenuous activities.A PSA test for prostate cancer screening will be ordered. Smoker Encouraged patient to discontinue smoking He has patches gums doesn't want wellbutrin Chest wall pain Intermittent chest pain, likely musculoskeletal due to physical activity, has been assessed. Given his smoking history and risk factors, coronary artery disease cannot be ruled out. We discussed the benefits and risks of a CT scan, including the detection of coronary artery blockages and lung cancer versus radiation exposure and costs. A CT coronary angiogram will be ordered to detect calcium blockages, noting that it does not detect cholesterol blockages, which would require a stress test. A stress test will be considered if the CT coronary angiogram is inconclusive. He is encouraged to cease smoking. Other emphysema (HCC)  Hypertension, unspecified type He presents with severe hypertension, BP 210/120, and non-compliance with amlodipine 5 mg. The benefits of losartan 25 mg and the importance of home BP monitoring were discussed. Losartan 25 mg will  be prescribed, and he is encouraged to monitor his blood pressure at home. A follow-up in 1-2 months for a BP check and medication adjustment is scheduled. Coronary artery disease due to calcified coronary lesion At a young age, noted on LDCT, associated with dyslipidemia/smoking. He is taking cholesterol medication(s), will start aspirin,  Dyslipidemia He has very low HDL cholesterol, likely genetic, and slightly elevated LDL at 120, increasing cardiovascular risk due to smoking and low HDL. Starting low-dose cholesterol medication with a gradual increase if tolerated is recommended, and he is encouraged to cease smoking. Lung nodule seen on imaging study A Lung RADS 2 benign nodule on CT scan presents a risk of malignancy with continued smoking. The importance of annual lung cancer screening and the risks of continued smoking were discussed. Annual lung cancer screening will be repeated, and he is encouraged to cease smoking and monitor for symptoms such as hemoptysis or increased dyspnea.  Hemoglobin is 15.5, previously 17.7, likely secondary to smoking, increasing the risk of stroke and heart attack due to thickened blood. The potential use of low-dose aspirin if coronary artery disease is detected was discussed, and he is encouraged to cease smoking. Also snoring possibility of obstructive sleep apnea encouraged patient to to use [employer] App.  Elevated D-dimer   A slightly elevated D-dimer, likely due to minor inflammation or injury, presents no significant concern. No immediate action is required.  General Health Maintenance    He is encouraged to make dietary modifications to reduce cardiovascular risk and to continue using nicotine replacement therapy for smoking cessation.  Follow-up   A follow-up in 1-2 months for a BP check and cholesterol re-evaluation is scheduled, along with a CT coronary angiogram  and annual lung cancer screening.       ED Discharge Orders          Ordered     PSA        10/31/23 1547    CT CARDIAC SCORING (SELF PAY ONLY)        10/31/23 1547    losartan (COZAAR) 25 MG tablet  Daily        10/31/23 1547          Diagnoses and all orders for this visit: FH: prostate cancer -     PSA Smoker -     CT CARDIAC SCORING (SELF PAY ONLY); Future Chest wall pain -     CT CARDIAC SCORING (SELF PAY ONLY); Future Other emphysema (HCC) Hypertension, unspecified type -     losartan (COZAAR) 25 MG tablet; Take 1 tablet (25 mg total) by mouth daily.  Recommended follow-up: No follow-ups on file. Future Appointments  Date Time Provider Department Center  12/13/2023  1:20 PM Lula Olszewski, MD [facility]  01/22/2024  2:00 PM [name], NP [facility] None  10/01/2024  2:40 PM Lula Olszewski, MD [facility]  Patient Care Team: Lula Olszewski, MD as PCP - General (Internal Medicine)    SUBJECTIVE: 42 y.o. male who has Shortness of breath; History of COVID-19; Chest wall pain; Dyslipidemia; Hypertension; Smoker; Coronary artery disease; COPD (chronic obstructive pulmonary disease) (HCC); and Emphysema lung (HCC) on their problem list.. Main reasons for visit/main concerns/chief complaint: 6 week follow-up  (Discuss lab results further and treatment plan.)   ------------------------------------------------------------------------------------------------------------------------ AI-Extracted: Discussed the use of AI scribe software for clinical note transcription with the patient, who gave verbal consent to proceed.  History of Present Illness   The patient, a current smoker with a family history of prostate cancer, presents with concerns about chest pain and shortness of breath. The patient reports a significant reduction in smoking, from half a pack per day to only a few cigarettes daily. The patient's chest pain, which was initially severe, has since resolved. The patient attributes this to a possible muscle strain due to physical work, but  acknowledges the potential risk of heart disease due to smoking. The patient also reports occasional shortness of breath, particularly during strenuous activities.  The patient has a history of elevated D-dimer and troponin levels, but recent tests indicate no active heart damage. The patient's blood counts are reported as normal, but there is a concern about potential sleep apnea due to reported snoring. The patient denies any recent episodes of apnea, but acknowledges a history of such episodes during childhood.  The patient has a benign lung nodule measuring 3.31mm, detected on a recent CT scan. The patient is aware of the risk of this nodule becoming malignant due to continued smoking.  The patient's blood pressure is reported as high, and there is a history of treatment with amlodipine. However, the patient admits to not taking this medication regularly. The patient's cholesterol profile shows a low HDL level and slightly elevated LDL level. The patient has been prescribed cholesterol medication but expresses concerns about potential side effects.  The patient's overall health behavior shows a willingness to make positive changes, particularly in reducing smoking.       Note that patient  has a past medical history of Headaches, cluster and Hypertension.  Problem list overviews that were updated at today's visit: Problem  Emphysema Lung (Hcc)    Med reconciliation: Current Outpatient Medications on File Prior to  Visit  Medication Sig   amLODipine (NORVASC) 5 MG tablet TAKE 1 TABLET BY MOUTH EVERY DAY   nicotine polacrilex (COMMIT) 4 MG lozenge RX #1 Weeks 1-6: 1 lozenge every 1-2 hours. Use at least 9 lozenges per day for the first 6 weeks. Max 20 lozenges per day.   nicotine polacrilex (COMMIT) 4 MG lozenge RX #2 Weeks 7-9: 1 lozenge every 2-4 hours.Max 20 lozenges per day.   nicotine polacrilex (COMMIT) 4 MG lozenge On Weeks 10-12: Use 1 lozenge every 4-8 hours. Max 20 lozenges per day.    nicotine polacrilex (NICORETTE) 4 MG gum RX #1 Weeks 1-6: 1 piece every 1-2 hours.Use at least 9 pieces of gum per day for the first 6 weeks. Max 24 pieces per day.   nicotine polacrilex (NICORETTE) 4 MG gum RX #2 Weeks 7-9: 1 piece every 2-4 hours.Max 24 pieces per day..   nicotine polacrilex (NICORETTE) 4 MG gum On weeks 10-12: Use 1 piece by mouth as directed every 4-8 hours. Max 24 pieces per day.   Rimegepant Sulfate (NURTEC) 75 MG TBDP Take 1 tablet (75 mg total) by mouth every other day. For migraine prevention   rosuvastatin (CRESTOR) 5 MG tablet Take 1 tablet (5 mg total) by mouth daily.   [DISCONTINUED] cetirizine (ZYRTEC) 10 MG tablet Take 10 mg by mouth daily.   [DISCONTINUED] diphenhydramine-acetaminophen (TYLENOL PM) 25-500 MG TABS tablet Take 1 tablet by mouth at bedtime as needed.   No current facility-administered medications on file prior to visit.  There are no discontinued medications.   Objective   Physical Exam     10/31/2023    3:06 PM 10/31/2023    3:02 PM 10/02/2023    1:41 PM  Vitals with BMI  Height  5\' 8"    Weight  179 lbs 13 oz   BMI  27.34   Systolic 143 130 161  Diastolic 101 98 85  Pulse  93    Wt Readings from Last 10 Encounters:  10/31/23 179 lb 12.8 oz (81.6 kg)  10/02/23 178 lb 6.4 oz (80.9 kg)  09/18/23 179 lb 6.4 oz (81.4 kg)  06/02/23 170 lb (77.1 kg)  01/16/23 173 lb (78.5 kg)  10/30/21 166 lb (75.3 kg)  09/27/21 166 lb (75.3 kg)  09/25/21 168 lb 3.2 oz (76.3 kg)  03/06/21 178 lb 9.6 oz (81 kg)  10/07/20 171 lb 6.4 oz (77.7 kg)   Vital signs reviewed.  Nursing notes reviewed. Weight trend reviewed. 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 Gets nasal vasodilation.  General Appearance:  No acute distress appreciable.   Well-groomed, healthy-appearing male.  Well proportioned with no abnormal fat distribution.  Good muscle tone. Pulmonary:  Normal work of breathing at rest, no  respiratory distress apparent. SpO2: 98 %  Musculoskeletal: All extremities are intact.  Neurological:  Awake, alert, oriented, and engaged.  No obvious focal neurological deficits or cognitive impairments.  Sensorium seems unclouded.   Speech is clear and coherent with logical content. Psychiatric:  Appropriate mood, pleasant and cooperative demeanor, thoughtful and engaged during the exam  Results   LABS D-dimer: slightly elevated (09/18/2023) Troponin: normal (09/18/2023) Hemoglobin (Hb): 15.5 g/dL Thyroid function tests: normal HDL: very low LDL: 120 mg/dL  RADIOLOGY CT scan: Lung-RADS 2 benign nodule, 3.9 mm        No results found for any visits on 10/31/23.  Office Visit on 09/18/2023  Component Date Value   Cholesterol 09/18/2023 177  Triglycerides 09/18/2023 136.0    HDL 09/18/2023 29.30 (L)    VLDL 09/18/2023 27.2    LDL Cholesterol 09/18/2023 120 (H)    Total CHOL/HDL Ratio 09/18/2023 6    NonHDL 09/18/2023 147.69    TSH 09/18/2023 0.88    Sodium 09/18/2023 139    Potassium 09/18/2023 4.0    Chloride 09/18/2023 104    CO2 09/18/2023 27    Glucose, Bld 09/18/2023 98    BUN 09/18/2023 5 (L)    Creatinine, Ser 09/18/2023 1.09    Total Bilirubin 09/18/2023 0.5    Alkaline Phosphatase 09/18/2023 65    AST 09/18/2023 21    ALT 09/18/2023 13    Total Protein 09/18/2023 7.0    Albumin 09/18/2023 4.2    GFR 09/18/2023 83.61    Calcium 09/18/2023 9.2    WBC 09/18/2023 7.9    RBC 09/18/2023 5.03    Hemoglobin 09/18/2023 15.5    HCT 09/18/2023 46.2    MCV 09/18/2023 91.9    MCHC 09/18/2023 33.5    RDW 09/18/2023 13.5    Platelets 09/18/2023 306.0    Neutrophils Relative % 09/18/2023 57.0    Lymphocytes Relative 09/18/2023 33.9    Monocytes Relative 09/18/2023 5.1    Eosinophils Relative 09/18/2023 3.0    Basophils Relative 09/18/2023 1.0    Neutro Abs 09/18/2023 4.5    Lymphs Abs 09/18/2023 2.7    Monocytes Absolute 09/18/2023 0.4    Eosinophils Absolute  09/18/2023 0.2    Basophils Absolute 09/18/2023 0.1    Troponin I 09/18/2023 <3    D-Dimer, Quant 09/18/2023 0.72 (H)    No image results found.   Low Dose CT Chest w/o Contrast for Lung Cancer Screening [[id number]]  Result Date: 10/24/2023 CLINICAL DATA:  Current smoker with 30 pack-year history EXAM: CT CHEST WITHOUT CONTRAST LOW-DOSE FOR LUNG CANCER SCREENING TECHNIQUE: Multidetector CT imaging of the chest was performed following the standard protocol without IV contrast. RADIATION DOSE REDUCTION: This exam was performed according to the departmental dose-optimization program which includes automated exposure control, adjustment of the mA and/or kV according to patient size and/or use of iterative reconstruction technique. COMPARISON:  None Available. FINDINGS: Cardiovascular: Normal heart size. No pericardial effusion. Normal caliber thoracic aorta with no significant atherosclerotic disease. Moderate coronary artery calcifications. Mediastinum/Nodes: Esophagus thyroid are unremarkable. No enlarged lymph nodes seen in the chest. Lungs/Pleura: Central airways are patent. Mild emphysema. No consolidation, pleural effusion or pneumothorax. Small solid pulmonary nodules. Reference solid nodule of the left upper lobe measuring 3.9 mm on image 70. Upper Abdomen: No acute abnormality. Musculoskeletal: No chest wall mass or suspicious bone lesions identified. IMPRESSION: 1. Lung-RADS 2S, benign appearance or behavior. Continue annual screening with low-dose chest CT without contrast in 12 months. S modifier for coronary artery calcifications. 2. Moderate coronary artery calcifications, recommend ASCVD risk assessment. 3. Emphysema (ICD10-J43.9). Electronically Signed   By: Allegra Lai M.D.   On: 10/24/2023 08:27    Low Dose CT Chest w/o Contrast for Lung Cancer Screening [[id number]]  Result Date: 10/24/2023 CLINICAL DATA:  Current smoker with 30 pack-year history EXAM: CT CHEST WITHOUT CONTRAST LOW-DOSE  FOR LUNG CANCER SCREENING TECHNIQUE: Multidetector CT imaging of the chest was performed following the standard protocol without IV contrast. RADIATION DOSE REDUCTION: This exam was performed according to the departmental dose-optimization program which includes automated exposure control, adjustment of the mA and/or kV according to patient size and/or use of iterative reconstruction technique. COMPARISON:  None Available. FINDINGS: Cardiovascular: Normal heart  size. No pericardial effusion. Normal caliber thoracic aorta with no significant atherosclerotic disease. Moderate coronary artery calcifications. Mediastinum/Nodes: Esophagus thyroid are unremarkable. No enlarged lymph nodes seen in the chest. Lungs/Pleura: Central airways are patent. Mild emphysema. No consolidation, pleural effusion or pneumothorax. Small solid pulmonary nodules. Reference solid nodule of the left upper lobe measuring 3.9 mm on image 70. Upper Abdomen: No acute abnormality. Musculoskeletal: No chest wall mass or suspicious bone lesions identified. IMPRESSION: 1. Lung-RADS 2S, benign appearance or behavior. Continue annual screening with low-dose chest CT without contrast in 12 months. S modifier for coronary artery calcifications. 2. Moderate coronary artery calcifications, recommend ASCVD risk assessment. 3. Emphysema (ICD10-J43.9). Electronically Signed   By: Allegra Lai M.D.   On: 10/24/2023 08:27         Additional Info: This encounter employed real-time, collaborative documentation. The patient actively reviewed and updated their medical record on a shared screen, ensuring transparency and facilitating joint problem-solving for the problem list, overview, and plan. This approach promotes accurate, informed care. The treatment plan was discussed and reviewed in detail, including medication safety, potential side effects, and all patient questions. We confirmed understanding and comfort with the plan. Follow-up instructions  were established, including contacting the office for any concerns, returning if symptoms worsen, persist, or new symptoms develop, and precautions for potential emergency department visits.

## 2023-10-31 NOTE — Assessment & Plan Note (Signed)
A Lung RADS 2 benign nodule on CT scan presents a risk of malignancy with continued smoking. The importance of annual lung cancer screening and the risks of continued smoking were discussed. Annual lung cancer screening will be repeated, and he is encouraged to cease smoking and monitor for symptoms such as hemoptysis or increased dyspnea.

## 2023-10-31 NOTE — Assessment & Plan Note (Signed)
Intermittent chest pain, likely musculoskeletal due to physical activity, has been assessed. Given his smoking history and risk factors, coronary artery disease cannot be ruled out. We discussed the benefits and risks of a CT scan, including the detection of coronary artery blockages and lung cancer versus radiation exposure and costs. A CT coronary angiogram will be ordered to detect calcium blockages, noting that it does not detect cholesterol blockages, which would require a stress test. A stress test will be considered if the CT coronary angiogram is inconclusive. He is encouraged to cease smoking.

## 2023-10-31 NOTE — Assessment & Plan Note (Signed)
Encouraged patient to discontinue smoking He has patches gums doesn't want wellbutrin

## 2023-11-01 LAB — PSA: PSA: 0.99 ng/mL (ref 0.10–4.00)

## 2023-11-01 NOTE — Telephone Encounter (Signed)
Patient was seen for an follow-up visit yesterday.

## 2023-11-12 ENCOUNTER — Other Ambulatory Visit (HOSPITAL_BASED_OUTPATIENT_CLINIC_OR_DEPARTMENT_OTHER): Payer: Self-pay

## 2023-11-15 ENCOUNTER — Ambulatory Visit
Admission: RE | Admit: 2023-11-15 | Discharge: 2023-11-15 | Disposition: A | Discharge status: Home / Self Care | Payer: No Typology Code available for payment source | Source: Ambulatory Visit | Attending: Internal Medicine

## 2023-11-15 DIAGNOSIS — R0789 Other chest pain: Secondary | ICD-10-CM

## 2023-11-15 DIAGNOSIS — F17200 Nicotine dependence, unspecified, uncomplicated: Secondary | ICD-10-CM

## 2023-11-24 ENCOUNTER — Encounter: Payer: Self-pay | Admitting: Internal Medicine

## 2023-11-26 NOTE — Telephone Encounter (Signed)
Patient's review of lab results/notes confirmed. Scheduled for an follow-up visit on 12/20.

## 2023-11-27 ENCOUNTER — Other Ambulatory Visit (HOSPITAL_BASED_OUTPATIENT_CLINIC_OR_DEPARTMENT_OTHER): Payer: Self-pay

## 2023-12-13 ENCOUNTER — Other Ambulatory Visit (HOSPITAL_BASED_OUTPATIENT_CLINIC_OR_DEPARTMENT_OTHER): Payer: Self-pay

## 2023-12-13 ENCOUNTER — Ambulatory Visit: Payer: Commercial Managed Care - PPO | Admitting: Internal Medicine

## 2023-12-13 ENCOUNTER — Encounter: Payer: Self-pay | Admitting: Internal Medicine

## 2023-12-13 VITALS — BP 126/90 | HR 92 | Temp 99.0°F | Ht 68.0 in | Wt 178.6 lb

## 2023-12-13 DIAGNOSIS — I2510 Atherosclerotic heart disease of native coronary artery without angina pectoris: Secondary | ICD-10-CM | POA: Diagnosis not present

## 2023-12-13 DIAGNOSIS — I10 Essential (primary) hypertension: Secondary | ICD-10-CM

## 2023-12-13 DIAGNOSIS — I2584 Coronary atherosclerosis due to calcified coronary lesion: Secondary | ICD-10-CM

## 2023-12-13 DIAGNOSIS — J438 Other emphysema: Secondary | ICD-10-CM | POA: Diagnosis not present

## 2023-12-13 MED ORDER — ROSUVASTATIN CALCIUM 40 MG PO TABS
40.0000 mg | ORAL_TABLET | Freq: Every day | ORAL | 3 refills | Status: AC
Start: 2023-12-13 — End: ?
  Filled 2023-12-13: qty 90, 90d supply, fill #0

## 2023-12-13 MED ORDER — ASPIRIN 81 MG PO TBEC: 81.0000 mg | DELAYED_RELEASE_TABLET | Freq: Every day | ORAL | Status: DC

## 2023-12-13 NOTE — Assessment & Plan Note (Signed)
Coronary Artery Disease (CAD)   Significant coronary atherosclerosis is noted with a calcium score of 297 in the left anterior descending (LAD) artery, exacerbated by smoking and hypertension. We discussed the critical importance of smoking cessation and dietary changes, highlighting that continued smoking and uncontrolled hypertension significantly increase the risk of a fatal myocardial infarction. Quitting smoking and adopting dietary changes can notably improve prognosis and potentially extend life expectancy to 90 years. We will prescribe a cholesterol-lowering medication and baby aspirin, increase the cholesterol medication dosage, and recommend a diet rich in extra virgin olive oil while avoiding high cholesterol and high sugar foods. A referral to a cardiologist for further evaluation and management is made, with a follow-up appointment scheduled in 1.5 weeks.

## 2023-12-13 NOTE — Patient Instructions (Addendum)
VISIT SUMMARY:  During your follow-up visit, we discussed your recent CT scan results and addressed your ongoing health concerns, including coronary artery disease, hypertension, and early signs of COPD. We also reviewed your lifestyle habits and made recommendations for improvements.  YOUR PLAN:  -CORONARY ARTERY DISEASE (CAD): Coronary artery disease is a condition where the arteries supplying blood to the heart become narrowed or blocked due to plaque buildup. Your CT scan showed significant atherosclerosis in the left anterior descending artery. We discussed the importance of quitting smoking and making dietary changes to improve your heart health. You will start taking a cholesterol-lowering medication and baby aspirin, and we will increase the dosage of your current cholesterol medication. A referral to a cardiologist has been made for further evaluation.  -HYPERTENSION: Hypertension, or high blood pressure, can lead to serious health problems if not controlled. Your blood pressure reading was 134/90, which is borderline high. We advised you to monitor your blood pressure at home daily and continue taking your current medication, losartan. Please record your readings and share them with [place] through MyChart.  -CHRONIC OBSTRUCTIVE PULMONARY DISEASE (COPD): COPD is a chronic lung disease that makes it hard to breathe. Your CT scan showed signs of early COPD, likely due to smoking. We discussed the importance of quitting smoking and referred you to a pulmonologist for further evaluation.  -GENERAL HEALTH MAINTENANCE: We recommend adopting a Mediterranean diet rich in extra virgin olive oil and engaging in regular physical activity to improve your overall health.  INSTRUCTIONS:  Please follow up with the cardiologist and pulmonologist as scheduled. Continue to monitor your blood pressure daily and record the readings. We will repeat your cholesterol levels in March. Your next follow-up appointment  with [place] is in 1.5 weeks.    Picture showing your LAD artery of the heart with lots of calcium plaque.

## 2023-12-13 NOTE — Assessment & Plan Note (Signed)
Hypertension   Blood pressure is recorded at 134/90, indicating borderline hypertension while on losartan. We advised regular home monitoring of blood pressure without increasing the losartan dosage at this time. The importance of maintaining blood pressure control to prevent further cardiovascular complications was discussed. He is to continue the current dosage of losartan, monitor blood pressure daily, record readings, and communicate these readings via MyChart.

## 2023-12-13 NOTE — Assessment & Plan Note (Signed)
Chronic Obstructive Pulmonary Disease (COPD)   Emphysema is seen on a recent CT scan, prompting a referral to a pulmonologist for further evaluation of possible early COPD. The impact of smoking on lung health and the importance of smoking cessation were discussed.

## 2023-12-13 NOTE — Progress Notes (Signed)
[facility]: [phone]   -- Medical Office Visit --  Patient:  Gabriel Petty      Age: 42 y.o.       Sex:  male  Date:   12/13/2023 Today's Healthcare Provider: Lula Olszewski, MD  =======================   CHIEF COMPLAINT: 6 week follow-up and Coronary Artery Disease    Assessment & Plan Coronary artery disease due to calcified coronary lesion Coronary Artery Disease (CAD)   Significant coronary atherosclerosis is noted with a calcium score of 297 in the left anterior descending (LAD) artery, exacerbated by smoking and hypertension. We discussed the critical importance of smoking cessation and dietary changes, highlighting that continued smoking and uncontrolled hypertension significantly increase the risk of a fatal myocardial infarction. Quitting smoking and adopting dietary changes can notably improve prognosis and potentially extend life expectancy to 90 years. We will prescribe a cholesterol-lowering medication and baby aspirin, increase the cholesterol medication dosage, and recommend a diet rich in extra virgin olive oil while avoiding high cholesterol and high sugar foods. A referral to a cardiologist for further evaluation and management is made, with a follow-up appointment scheduled in 1.5 weeks. Other emphysema (HCC) Chronic Obstructive Pulmonary Disease (COPD)   Emphysema is seen on a recent CT scan, prompting a referral to a pulmonologist for further evaluation of possible early COPD. The impact of smoking on lung health and the importance of smoking cessation were discussed. Hypertension, unspecified type Hypertension   Blood pressure is recorded at 134/90, indicating borderline hypertension while on losartan. We advised regular home monitoring of blood pressure without increasing the losartan dosage at this time. The importance of maintaining blood pressure control to prevent further cardiovascular complications was discussed. He is  to continue the current dosage of losartan, monitor blood pressure daily, record readings, and communicate these readings via MyChart.     Orders Placed During this Encounter:   Orders Placed This Encounter  Procedures   Ambulatory referral to Pulmonology    Referral Priority:   Routine    Referral Type:   Consultation    Referral Reason:   Specialty Services Required    Requested Specialty:   Pulmonary Disease    Number of Visits Requested:   1   Ambulatory referral to Cardiology    Referral Priority:   Routine    Referral Type:   Consultation    Referral Reason:   Specialty Services Required    Number of Visits Requested:   1   Meds ordered this encounter  Medications   rosuvastatin (CRESTOR) 40 MG tablet    Sig: Take 1 tablet (40 mg total) by mouth daily.    Dispense:  90 tablet    Refill:  3   aspirin EC 81 MG tablet    Sig: Take 1 tablet (81 mg total) by mouth daily. Swallow whole.    General Health Maintenance   We advised on lifestyle modifications to improve overall health, including dietary changes to a Mediterranean diet rich in extra virgin olive oil and encouraging regular physical activity.  Follow-up   A follow-up appointment is scheduled in 1.5 weeks, with cardiology and pulmonology appointments also scheduled. Cholesterol levels will be repeated in March.  Medical Decision Making: 2 or more stable chronic illnesses Independent interpretation of a test performed by another physician; imaging such as XR or EKG reinterpretations Prescription drug management     SUBJECTIVE: 42 y.o. male who has Shortness of breath; History of COVID-19; Chest wall  pain; Dyslipidemia; Hypertension; Smoker; Coronary artery disease; COPD (chronic obstructive pulmonary disease) (HCC); Emphysema lung (HCC); and Lung nodule seen on imaging study on their problem list.  History of Present Illness The patient, a 42 year old with a history of hypertension and hyperlipidemia, presented for  a follow-up visit after a recent CT scan. The patient reported a significant reduction in smoking, having not purchased a pack of cigarettes in a while, but admitted to still smoking occasionally. The patient has been compliant with prescribed medications, including losartan for hypertension and a cholesterol-lowering agent. However, the patient reported feeling more fatigued since starting these medications.  The patient also mentioned a past severe illness, possibly COVID-19, which resulted in a partially collapsed lung and significant debilitation. The patient reported having to stop and rest while climbing stairs due to extreme fatigue during this illness. The patient has not been monitoring blood pressure at home but reported a recent reading of 134/90 taken by his spouse.  Regarding dietary habits, the patient reported not being a heavy eater and has recently incorporated more seafood into his diet. The patient also expressed a liking for desserts. Despite these lifestyle modifications, the patient's recent CT scan revealed significant coronary atherosclerosis, particularly in the left anterior descending artery, also known as the "widow maker." The patient also has evidence of early COPD, possibly due to smoking, as seen on the CT scan.  Note that patient  has a past medical history of Headaches, cluster and Hypertension.  Medications - Losartan (recently started) - Cholesterol pill recently started - Baby aspirin not started yet  Images shown and image added to AVS for review  Calcium Score: 297 on LAD (11/29/2023) Cardiac CT: Calcium deposits in the left anterior descending artery and first two branches (11/29/2023) CT Scan: Emphysema in lungs (11/29/2023)  Problem list overviews that were updated at today's visit:No problems updated.  Med reconciliation: Current Outpatient Medications on File Prior to Visit  Medication Sig   amLODipine (NORVASC) 5 MG tablet TAKE 1 TABLET BY MOUTH  EVERY DAY   aspirin EC 81 MG tablet Take 1 tablet (81 mg total) by mouth daily. Swallow whole.   losartan (COZAAR) 25 MG tablet Take 1 tablet (25 mg total) by mouth daily.   nicotine polacrilex (COMMIT) 4 MG lozenge RX #1 Weeks 1-6: 1 lozenge every 1-2 hours. Use at least 9 lozenges per day for the first 6 weeks. Max 20 lozenges per day.   nicotine polacrilex (COMMIT) 4 MG lozenge RX #2 Weeks 7-9: 1 lozenge every 2-4 hours.Max 20 lozenges per day.   nicotine polacrilex (COMMIT) 4 MG lozenge On Weeks 10-12: Use 1 lozenge every 4-8 hours. Max 20 lozenges per day.   nicotine polacrilex (NICORETTE) 4 MG gum RX #1 Weeks 1-6: 1 piece every 1-2 hours.Use at least 9 pieces of gum per day for the first 6 weeks. Max 24 pieces per day.   nicotine polacrilex (NICORETTE) 4 MG gum RX #2 Weeks 7-9: 1 piece every 2-4 hours.Max 24 pieces per day..   nicotine polacrilex (NICORETTE) 4 MG gum On weeks 10-12: Use 1 piece by mouth as directed every 4-8 hours. Max 24 pieces per day.   Rimegepant Sulfate (NURTEC) 75 MG TBDP Take 1 tablet (75 mg total) by mouth every other day. For migraine prevention   [DISCONTINUED] cetirizine (ZYRTEC) 10 MG tablet Take 10 mg by mouth daily.   [DISCONTINUED] diphenhydramine-acetaminophen (TYLENOL PM) 25-500 MG TABS tablet Take 1 tablet by mouth at bedtime as needed.   No  current facility-administered medications on file prior to visit.   Medications Discontinued During This Encounter  Medication Reason   rosuvastatin (CRESTOR) 5 MG tablet Not covered by the pt's insurance      Objective   Physical Exam     12/13/2023    1:30 PM 12/13/2023    1:25 PM 10/31/2023    3:06 PM  Vitals with BMI  Height  5\' 8"    Weight  178 lbs 10 oz   BMI  27.16   Systolic 126 124 213  Diastolic 90 90 101  Pulse  92    Wt Readings from Last 10 Encounters:  12/13/23 178 lb 9.6 oz (81 kg)  10/31/23 179 lb 12.8 oz (81.6 kg)  10/02/23 178 lb 6.4 oz (80.9 kg)  09/18/23 179 lb 6.4 oz (81.4 kg)   06/02/23 170 lb (77.1 kg)  01/16/23 173 lb (78.5 kg)  10/30/21 166 lb (75.3 kg)  09/27/21 166 lb (75.3 kg)  09/25/21 168 lb 3.2 oz (76.3 kg)  03/06/21 178 lb 9.6 oz (81 kg)   Vital signs reviewed.  Nursing notes reviewed. Weight trend reviewed. Abnormalities and Problem-Specific physical exam findings:    General Appearance:  No acute distress appreciable.   Well-groomed, healthy-appearing male.  Well proportioned with no abnormal fat distribution.  Good muscle tone. Pulmonary:  Normal work of breathing at rest, no respiratory distress apparent. SpO2: 97 %  Musculoskeletal: All extremities are intact.  Neurological:  Awake, alert, oriented, and engaged.  No obvious focal neurological deficits or cognitive impairments.  Sensorium seems unclouded.   Speech is clear and coherent with logical content. Psychiatric:  Appropriate mood, pleasant and cooperative demeanor, thoughtful and engaged during the exam    No results found for any visits on 12/13/23. Office Visit on 10/31/2023  Component Date Value   PSA 10/31/2023 0.99   Office Visit on 09/18/2023  Component Date Value   Cholesterol 09/18/2023 177    Triglycerides 09/18/2023 136.0    HDL 09/18/2023 29.30 (L)    VLDL 09/18/2023 27.2    LDL Cholesterol 09/18/2023 120 (H)    Total CHOL/HDL Ratio 09/18/2023 6    NonHDL 09/18/2023 147.69    TSH 09/18/2023 0.88    Sodium 09/18/2023 139    Potassium 09/18/2023 4.0    Chloride 09/18/2023 104    CO2 09/18/2023 27    Glucose, Bld 09/18/2023 98    BUN 09/18/2023 5 (L)    Creatinine, Ser 09/18/2023 1.09    Total Bilirubin 09/18/2023 0.5    Alkaline Phosphatase 09/18/2023 65    AST 09/18/2023 21    ALT 09/18/2023 13    Total Protein 09/18/2023 7.0    Albumin 09/18/2023 4.2    GFR 09/18/2023 83.61    Calcium 09/18/2023 9.2    WBC 09/18/2023 7.9    RBC 09/18/2023 5.03    Hemoglobin 09/18/2023 15.5    HCT 09/18/2023 46.2    MCV 09/18/2023 91.9    MCHC 09/18/2023 33.5    RDW  09/18/2023 13.5    Platelets 09/18/2023 306.0    Neutrophils Relative % 09/18/2023 57.0    Lymphocytes Relative 09/18/2023 33.9    Monocytes Relative 09/18/2023 5.1    Eosinophils Relative 09/18/2023 3.0    Basophils Relative 09/18/2023 1.0    Neutro Abs 09/18/2023 4.5    Lymphs Abs 09/18/2023 2.7    Monocytes Absolute 09/18/2023 0.4    Eosinophils Absolute 09/18/2023 0.2    Basophils Absolute 09/18/2023 0.1    Troponin I  09/18/2023 <3    D-Dimer, Quant 09/18/2023 0.72 (H)   No image results found. CT CARDIAC SCORING ([facility] LOCATIONS ONLY) Result Date: 11/18/2023 CLINICAL DATA:  Smoking history. CAD screening, high CAD risk, treadmill candidate * Tracking Code: FCC * EXAM: CT CARDIAC CORONARY ARTERY CALCIUM SCORE TECHNIQUE: Non-contrast imaging through the heart was performed using prospective ECG gating. Image post processing was performed on an independent workstation, allowing for quantitative analysis of the heart and coronary arteries. Note that this exam targets the heart and the chest was not imaged in its entirety. COMPARISON:  None available. FINDINGS: CORONARY CALCIUM SCORES: Left Main: 0 LAD: 294 LCx: 0 RCA/PDA: 3 Total Agatston Score: 297 MESA database percentile: 99. Note, this is compared to 35 year olds, the youngest in the [facility] database. AORTA MEASUREMENTS: Ascending Aorta: 2.9 cm Descending Aorta:2.2 cm OTHER FINDINGS: Heart is normal size. Aorta normal caliber. No adenopathy. No confluent opacities or effusions. No acute findings in the upper abdomen. Chest wall soft tissues are unremarkable. No acute bony abnormality. IMPRESSION: Total Agatston score: 297 [place] database percentile: 99 when compared to 94 year olds, the youngest in the [facility]. No acute or significant extracardiac abnormality. Electronically Signed   By: Charlett Nose M.D.   On: 11/18/2023 13:56   Low Dose CT Chest w/o Contrast for Lung Cancer Screening [[id number]] Result Date: 10/24/2023 CLINICAL DATA:   Current smoker with 30 pack-year history EXAM: CT CHEST WITHOUT CONTRAST LOW-DOSE FOR LUNG CANCER SCREENING TECHNIQUE: Multidetector CT imaging of the chest was performed following the standard protocol without IV contrast. RADIATION DOSE REDUCTION: This exam was performed according to the departmental dose-optimization program which includes automated exposure control, adjustment of the mA and/or kV according to patient size and/or use of iterative reconstruction technique. COMPARISON:  None Available. FINDINGS: Cardiovascular: Normal heart size. No pericardial effusion. Normal caliber thoracic aorta with no significant atherosclerotic disease. Moderate coronary artery calcifications. Mediastinum/Nodes: Esophagus thyroid are unremarkable. No enlarged lymph nodes seen in the chest. Lungs/Pleura: Central airways are patent. Mild emphysema. No consolidation, pleural effusion or pneumothorax. Small solid pulmonary nodules. Reference solid nodule of the left upper lobe measuring 3.9 mm on image 70. Upper Abdomen: No acute abnormality. Musculoskeletal: No chest wall mass or suspicious bone lesions identified. IMPRESSION: 1. Lung-RADS 2S, benign appearance or behavior. Continue annual screening with low-dose chest CT without contrast in 12 months. S modifier for coronary artery calcifications. 2. Moderate coronary artery calcifications, recommend ASCVD risk assessment. 3. Emphysema (ICD10-J43.9). Electronically Signed   By: Allegra Lai M.D.   On: 10/24/2023 08:27  CT CARDIAC SCORING ([facility] LOCATIONS ONLY) Result Date: 11/18/2023       This document was synthesized by artificial intelligence (Abridge) using HIPAA-compliant recording of the clinical interaction;   We discussed the use of AI scribe software for clinical note transcription with the patient, who gave verbal consent to proceed.    Additional Info: This encounter employed state-of-the-art, real-time, collaborative documentation. The patient actively  reviewed and assisted in updating their electronic medical record on a shared screen, ensuring transparency and facilitating joint problem-solving for the problem list, overview, and plan. This approach promotes accurate, informed care. The treatment plan was discussed and reviewed in detail, including medication safety, potential side effects, and all patient questions. We confirmed understanding and comfort with the plan. Follow-up instructions were established, including contacting the office for any concerns, returning if symptoms worsen, persist, or new symptoms develop, and precautions for potential emergency department visits.

## 2023-12-16 ENCOUNTER — Other Ambulatory Visit: Payer: Self-pay | Admitting: Internal Medicine

## 2023-12-16 ENCOUNTER — Other Ambulatory Visit (HOSPITAL_BASED_OUTPATIENT_CLINIC_OR_DEPARTMENT_OTHER): Payer: Self-pay

## 2023-12-16 DIAGNOSIS — I2510 Atherosclerotic heart disease of native coronary artery without angina pectoris: Secondary | ICD-10-CM

## 2023-12-17 ENCOUNTER — Other Ambulatory Visit (HOSPITAL_BASED_OUTPATIENT_CLINIC_OR_DEPARTMENT_OTHER): Payer: Self-pay

## 2023-12-20 ENCOUNTER — Other Ambulatory Visit (HOSPITAL_BASED_OUTPATIENT_CLINIC_OR_DEPARTMENT_OTHER): Payer: Self-pay

## 2023-12-27 NOTE — Progress Notes (Signed)
 CARDIOLOGY CONSULT NOTE       Patient ID: Gabriel Petty MRN: [id number] DOB/AGE: 08-13-1981 43 y.o.   Referring Physician: Jesus Primary Physician: Jesus Bernardino MATSU, MD Primary Cardiologist: New Reason for Consultation: CAD   HPI:  43 y.o. referred by Jesus for CAD. He is a smoker Calcium  score done 11/18/23 elevated 297 primarily noted in LAD. This was 99 th percentile for age/sex. Current smoker with 30 year pack history Lung cancer CT October 2024 no cancer , emphysema. LDL done 09/18/23 was 120. On norvasc  and losartan  for HTN.  Started on crestor  40 mg 12/13/23   No chest pain. 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 Wife works doing collections for long term care facility 4 kids one living in [place] now  Smokes a few cigs/day now No active wheezing   ROS All other systems reviewed and negative except as noted above  Past Medical History:  Diagnosis Date   Headaches, cluster    Hypertension    Smoker     Family History  Problem Relation Age of Onset   Hypertension Mother    Healthy Father    Cancer Maternal Aunt    Diabetes Maternal Grandmother    Thyroid disease Maternal Grandfather    Cancer Maternal Grandfather     Social History   Socioeconomic History   Marital status: Married    Spouse name: Not on file   Number of children: 4   Years of education: Not on file   Highest education level: GED or equivalent  Occupational History   Not on file  Tobacco Use   Smoking status: Every Day    Current packs/day: 1.00    Average packs/day: 0.9 packs/day for 25.0 years (22.8 ttl pk-yrs)    Types: Cigarettes   Smokeless tobacco: Never   Tobacco comments:    09/25/21 4 a day  Vaping Use   Vaping status: Never Used  Substance and Sexual Activity   Alcohol use: No   Drug use: Yes    Types: Marijuana   Sexual activity: Yes    Birth control/protection: None    Comment: Wife had Tubial ligation  Other Topics Concern   Not on file   Social History Narrative   Married, has 4 children. Works as a Product manager at [employer]. Drinks 4 oz of coffee a day.    Social Drivers of Health   Financial Resource Strain: Medium Risk (12/09/2023)   Overall Financial Resource Strain (CARDIA)    Difficulty of Paying Living Expenses: Somewhat hard  Food Insecurity: Food Insecurity Present (12/09/2023)   Hunger Vital Sign    Worried About Running Out of Food in the Last Year: Sometimes true    Ran Out of Food in the Last Year: Sometimes true  Transportation Needs: No Transportation Needs (12/09/2023)   PRAPARE - Administrator, Civil Service (Medical): No    Lack of Transportation (Non-Medical): No  Physical Activity: Sufficiently Active (12/09/2023)   Exercise Vital Sign    Days of Exercise per Week: 5 days    Minutes of Exercise per Session: 60 min  Stress: Stress Concern Present (12/09/2023)   [employer] of Occupational Health - Occupational Stress Questionnaire    Feeling of Stress : Rather much  Social Connections: Moderately Isolated (12/09/2023)   Social Connection and Isolation Panel [NHANES]    Frequency of Communication with Friends and Family: More than three times a week    Frequency of Social  Gatherings with Friends and Family: Once a week    Attends Religious Services: Never    Database Administrator or Organizations: No    Attends Engineer, Structural: Not on file    Marital Status: Married  Intimate Partner Violence: Unknown (03/30/2022)   Received from [employer], [facility]   HITS    Physically Hurt: Not on file    Insult or Talk Down To: Not on file    Threaten Physical Harm: Not on file    Scream or Curse: Not on file    Past Surgical History:  Procedure Laterality Date   arm surgery  2003   plate, screws R arm   CERVICAL FUSION  11/15/2020   C5-7   SPINE SURGERY  2021      Current Outpatient Medications:    aspirin  EC 81 MG tablet, Take 1  tablet (81 mg total) by mouth daily. Swallow whole., Disp: 90 tablet, Rfl: 3   losartan  (COZAAR ) 25 MG tablet, Take 1 tablet (25 mg total) by mouth daily., Disp: 90 tablet, Rfl: 3   nicotine  polacrilex (NICORETTE ) 4 MG gum, On weeks 10-12: Use 1 piece by mouth as directed every 4-8 hours. Max 24 pieces per day., Disp: 110 each, Rfl: 0   Rimegepant Sulfate  (NURTEC) 75 MG TBDP, Take 1 tablet (75 mg total) by mouth every other day. For migraine prevention, Disp: 15 tablet, Rfl: 12   rosuvastatin  (CRESTOR ) 40 MG tablet, Take 1 tablet (40 mg total) by mouth daily., Disp: 90 tablet, Rfl: 3    Physical Exam: Blood pressure (!) 140/92, pulse 63, resp. rate 16, height 5' 8 (1.727 m), weight 182 lb 9.6 oz (82.8 kg), SpO2 96%.    Affect appropriate Healthy:  appears stated age HEENT: normal Neck supple with no adenopathy JVP normal no bruits no thyromegaly Lungs clear with no wheezing and good diaphragmatic motion Heart:  S1/S2 no murmur, no rub, gallop or click PMI normal Abdomen: benighn, BS positve, no tenderness, no AAA no bruit.  No HSM or HJR Distal pulses intact with no bruits No edema Neuro non-focal Skin warm and dry No muscular weakness   Labs:   Lab Results  Component Value Date   WBC 7.9 09/18/2023   HGB 15.5 09/18/2023   HCT 46.2 09/18/2023   MCV 91.9 09/18/2023   PLT 306.0 09/18/2023   No results for input(s): NA, K, CL, CO2, BUN, CREATININE, CALCIUM , PROT, BILITOT, ALKPHOS, ALT, AST, GLUCOSE in the last 168 hours.  Invalid input(s): LABALBU Lab Results  Component Value Date   TROPONINI <3 09/18/2023    Lab Results  Component Value Date   CHOL 177 09/18/2023   CHOL 221 (H) 08/05/2020   Lab Results  Component Value Date   HDL 29.30 (L) 09/18/2023   HDL 32 (L) 08/05/2020   Lab Results  Component Value Date   LDLCALC 120 (H) 09/18/2023   LDLCALC 161 (H) 08/05/2020   Lab Results  Component Value Date   TRIG 136.0 09/18/2023    TRIG 150 (H) 08/05/2020   Lab Results  Component Value Date   CHOLHDL 6 09/18/2023   CHOLHDL 6.9 (H) 08/05/2020   No results found for: LDLDIRECT    Radiology: No results found.  EKG: 09/18/23 SR rate 69 normal    ASSESSMENT AND PLAN:   CAD: elevated calcium  score for age. D/c smoking, statin and 81 mg ASA will order baseline ETT since calcium  mostly in LAD and ECG is normal HTN:  continue norvasc /losartan  consider adding diuretic if he has HTN response to exercise. Weight loss and low sodium DASH diet discussed as lifestyle changes Smoking: discussed emphysema on CT Discussed accelerated coronary plaque as consequence He has cut back. He has been referred to pulmonary They can consider PFT;s  HLD:  repeat labs on statin March Target LDL < 55 now on crestor   ETT Lipid / Liver March  F/U in a year   Signed: Maude Emmer 01/01/2024, 11:12 AM

## 2024-01-01 ENCOUNTER — Encounter: Payer: Self-pay | Admitting: Cardiovascular Disease

## 2024-01-01 ENCOUNTER — Other Ambulatory Visit: Payer: Self-pay

## 2024-01-01 ENCOUNTER — Ambulatory Visit: Payer: Commercial Managed Care - PPO | Attending: Cardiovascular Disease | Admitting: Cardiovascular Disease

## 2024-01-01 VITALS — BP 140/92 | HR 63 | Resp 16 | Ht 68.0 in | Wt 182.6 lb

## 2024-01-01 DIAGNOSIS — I2584 Coronary atherosclerosis due to calcified coronary lesion: Secondary | ICD-10-CM

## 2024-01-01 DIAGNOSIS — F17200 Nicotine dependence, unspecified, uncomplicated: Secondary | ICD-10-CM | POA: Diagnosis not present

## 2024-01-01 DIAGNOSIS — I2510 Atherosclerotic heart disease of native coronary artery without angina pectoris: Secondary | ICD-10-CM

## 2024-01-01 DIAGNOSIS — R079 Chest pain, unspecified: Secondary | ICD-10-CM

## 2024-01-01 NOTE — Patient Instructions (Signed)
 Medication Instructions:  Your physician recommends that you continue on your current medications as directed. Please refer to the Current Medication list given to you today.  *If you need a refill on your cardiac medications before your next appointment, please call your pharmacy*   Lab Work: MARCH: FASTING Lipid panel and LFT If you have labs (blood work) drawn today and your tests are completely normal, you will receive your results only by: MyChart Message (if you have MyChart) OR A paper copy in the mail If you have any lab test that is abnormal or we need to change your treatment, we will call you to review the results.   Testing/Procedures: Your physician has requested that you have an exercise tolerance test. For further information please visit [email or web address] Please also follow instruction sheet, as given.    Follow-Up: At [facility], you and your health needs are our priority.  As part of our continuing mission to provide you with exceptional heart care, we have created designated Provider Care Teams.  These Care Teams include your primary Cardiologist (physician) and Advanced Practice Providers (APPs -  Physician Assistants and Nurse Practitioners) who all work together to provide you with the care you need, when you need it.  We recommend signing up for the patient portal called MyChart.  Sign up information is provided on this After Visit Summary.  MyChart is used to connect with patients for Virtual Visits (Telemedicine).  Patients are able to view lab/test results, encounter notes, upcoming appointments, etc.  Non-urgent messages can be sent to your provider as well.   To learn more about what you can do with MyChart, go to [email or web address].    Your next appointment:   1 year(s)  Provider:   Maude Emmer, MD

## 2024-01-09 ENCOUNTER — Encounter (HOSPITAL_COMMUNITY): Payer: Self-pay

## 2024-01-10 ENCOUNTER — Ambulatory Visit (HOSPITAL_COMMUNITY): Payer: Commercial Managed Care - PPO | Attending: Cardiovascular Disease

## 2024-01-10 DIAGNOSIS — I2584 Coronary atherosclerosis due to calcified coronary lesion: Secondary | ICD-10-CM

## 2024-01-10 DIAGNOSIS — I2510 Atherosclerotic heart disease of native coronary artery without angina pectoris: Secondary | ICD-10-CM | POA: Diagnosis present

## 2024-01-12 LAB — EXERCISE TOLERANCE TEST
Estimated workload: 10.5
Exercise duration (min): 9 min
Exercise duration (sec): 15 s
MPHR: 178 {beats}/min
Peak HR: 151 {beats}/min
Percent HR: 85 %
Rest HR: 83 {beats}/min
ST Depression (mm): 0 mm

## 2024-01-22 ENCOUNTER — Ambulatory Visit (INDEPENDENT_AMBULATORY_CARE_PROVIDER_SITE_OTHER): Payer: Commercial Managed Care - PPO | Admitting: Adult Health

## 2024-01-22 ENCOUNTER — Other Ambulatory Visit (HOSPITAL_BASED_OUTPATIENT_CLINIC_OR_DEPARTMENT_OTHER): Payer: Self-pay

## 2024-01-22 VITALS — BP 145/83 | HR 80 | Ht 68.0 in | Wt 178.0 lb

## 2024-01-22 DIAGNOSIS — G43719 Chronic migraine without aura, intractable, without status migrainosus: Secondary | ICD-10-CM | POA: Diagnosis not present

## 2024-01-22 MED ORDER — NURTEC 75 MG PO TBDP
ORAL_TABLET | ORAL | 12 refills | Status: AC
  Filled 2024-01-22: qty 8, 30d supply, fill #0
  Filled 2024-10-16: qty 8, 30d supply, fill #1

## 2024-01-22 MED ORDER — QULIPTA 60 MG PO TABS
60.0000 mg | ORAL_TABLET | Freq: Every day | ORAL | 11 refills | Status: AC
  Filled 2024-01-22: qty 30, 30d supply, fill #0
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
## 2024-01-22 NOTE — Patient Instructions (Addendum)
Your Plan:  Start Qulipta 60 mg daily to prevent headaches Change nurtec to only taking when you have a headache. Take 1 tablet at the onset of migraine. Only 1 tablet in 24 hours.  If your symptoms worsen or you develop new symptoms please let us know.    Thank you for coming to see [place] at [facility]. I hope we have been able to provide you high quality care today.  You may receive a patient satisfaction survey over the next few weeks. We would appreciate your feedback and comments so that we may continue to improve ourselves and the health of our patients.

## 2024-01-23 ENCOUNTER — Telehealth: Payer: Self-pay | Admitting: Cardiovascular Disease

## 2024-01-23 NOTE — Telephone Encounter (Signed)
Patient had a missed call from our office. Patient stated they did not leave a message. Informed patient that I do not see any notes of anyone calling him or trying to get in touch with him. Informed patient that someone might have dialed the wrong number. Informed patient that if he needs anything to please let us know.

## 2024-01-23 NOTE — Telephone Encounter (Signed)
Patient states he is returning a call.  Please advise.

## 2024-01-26 ENCOUNTER — Other Ambulatory Visit (HOSPITAL_BASED_OUTPATIENT_CLINIC_OR_DEPARTMENT_OTHER): Payer: Self-pay

## 2024-01-27 ENCOUNTER — Other Ambulatory Visit (HOSPITAL_BASED_OUTPATIENT_CLINIC_OR_DEPARTMENT_OTHER): Payer: Self-pay

## 2024-01-31 ENCOUNTER — Telehealth: Payer: Self-pay

## 2024-01-31 ENCOUNTER — Other Ambulatory Visit (HOSPITAL_BASED_OUTPATIENT_CLINIC_OR_DEPARTMENT_OTHER): Payer: Self-pay

## 2024-01-31 NOTE — Telephone Encounter (Signed)
*  [facility]  Pharmacy Patient Advocate Encounter   Received notification from Fax that prior authorization for Qulipta  60MG  tablets  is required/requested.   Insurance verification completed.   The patient is insured through [facility] .   Per test claim: PA required; PA submitted to above mentioned insurance via CoverMyMeds Key/confirmation #/EOC [id number] Status is pending

## 2024-02-03 ENCOUNTER — Institutional Professional Consult (permissible substitution): Payer: Commercial Managed Care - PPO | Admitting: Pulmonary Disease

## 2024-02-07 NOTE — Telephone Encounter (Signed)
Pharmacy Patient Advocate Encounter  Received notification from [facility] that Prior Authorization for Bennie Pierini has been APPROVED from 01/31/2024 to 07/30/2024

## 2024-02-14 ENCOUNTER — Encounter: Payer: Self-pay | Admitting: Acute Care

## 2024-02-14 ENCOUNTER — Other Ambulatory Visit (HOSPITAL_BASED_OUTPATIENT_CLINIC_OR_DEPARTMENT_OTHER): Payer: Self-pay

## 2024-02-14 ENCOUNTER — Ambulatory Visit: Payer: Commercial Managed Care - PPO | Admitting: Acute Care

## 2024-02-14 VITALS — BP 132/88 | HR 78 | Ht 68.0 in | Wt 184.2 lb

## 2024-02-14 DIAGNOSIS — R918 Other nonspecific abnormal finding of lung field: Secondary | ICD-10-CM

## 2024-02-14 DIAGNOSIS — R5381 Other malaise: Secondary | ICD-10-CM | POA: Diagnosis not present

## 2024-02-14 DIAGNOSIS — J439 Emphysema, unspecified: Secondary | ICD-10-CM

## 2024-02-14 DIAGNOSIS — R911 Solitary pulmonary nodule: Secondary | ICD-10-CM

## 2024-02-14 DIAGNOSIS — R0600 Dyspnea, unspecified: Secondary | ICD-10-CM

## 2024-02-14 MED ORDER — ALBUTEROL SULFATE HFA 108 (90 BASE) MCG/ACT IN AERS
1.0000 | INHALATION_SPRAY | Freq: Four times a day (QID) | RESPIRATORY_TRACT | 2 refills | Status: AC | PRN
  Filled 2024-02-14: qty 6.7, 25d supply, fill #0
  Filled 2024-10-16: qty 6.7, 28d supply, fill #0

## 2024-02-14 NOTE — Patient Instructions (Addendum)
It is good to see you today. We will do a CT Chest without contrast in October 2025 to follow the lung nodules. We will do PFT's to evaluate for COPD I will send in an inhaler for shortness of breath. Albuterol, 1-2 puffs as needed for shortness of breath or wheezing.  If you need more than 6 puffs a day, call to be seen. Follow up after PFT's and again after CT Chest 09/2024. If you have COPD we will consider maintenance inhalers.  You will get calls to schedule these appointments. Call if you need [place] sooner Please contact office for sooner follow up if symptoms do not improve or worsen or seek emergency care

## 2024-02-14 NOTE — Progress Notes (Addendum)
History of Present Illness Gabriel Petty is a 43 y.o. male current every day smoker with Shortness of breath; History of COVID-19 2021; Chest wall pain; Dyslipidemia; Hypertension; Smoker; Coronary artery disease; COPD (chronic obstructive pulmonary disease) (HCC); Emphysema lung (HCC); and Lung nodule seen on imaging study on their problem list. He has been referred to pulmonary 01/2024 for evaluation . He will be followed by Dr. Delton Coombes.   02/14/2024 Pt. Presents for consultation for COPD and pulmonary nodule.  Lung Nodule  Discussed the use of AI scribe software for clinical note transcription with the patient, who gave verbal consent to proceed.  History of Present Illness   Gabriel Petty is a 43 year old male with COPD and emphysema who presents with shortness of breath and chest wall pain. He was referred by Dr. Jon Billings for evaluation of a lung nodule.  He experiences shortness of breath primarily with exertion, which he associates with a history of COVID-19 infection in early 2021. He has no unintentional weight loss, hemoptysis, or significant cough, but does have nasal congestion and occasionally blows his nose. He has not been tried on an inhaler or undergone pulmonary function tests previously.  A lung nodule was discovered during a low-dose screening CT scan in October 2024, initially ordered by Dr. Jon Billings. The nodule measures 3.9 millimeters and is located in the left upper lobe. There has been no follow-up imaging since its discovery.  He has a significant smoking history, having smoked Newports for almost thirty years, but recently switched to using a nicotine vape a few days ago. He uses the vape three times a day and denies using smokeless tobacco.  His past medical history includes hypertension, emphysema, elevated cholesterol, and chronic headaches. He underwent neck and back surgery in November 2021. He is married, lives with his wife and son, and works as an Product/process development scientist. He has  no recent travel history.   Family history, his grandfather had small cell lung cancer and prostate cancer, and his maternal side has a history of breast cancer. His aunt recently died from brain cancer.   He is marries, lives with his wife and Gabriel Petty, works as an Product/process development scientist. No recent travel, no significant allergies. He has had prednisone in the past.   Surgical history of Neck, back surgery 11/21     Test Results: 10/15/2023 LDCT Chest Lung-RADS 2S, benign appearance or behavior. Continue annual screening with low-dose chest CT without contrast in 12 months. S modifier for coronary artery calcifications. 2. Moderate coronary artery calcifications, recommend ASCVD risk assessment. 3. Emphysema (ICD10-J43.9)     Latest Ref Rng & Units 09/18/2023    4:16 PM 10/07/2020    9:58 AM 03/15/2020    3:23 PM  CBC  WBC 4.0 - 10.5 K/uL 7.9  8.3  8.0   Hemoglobin 13.0 - 17.0 g/dL 16.1  09.6  04.5   Hematocrit 39.0 - 52.0 % 46.2  49.0  44.7   Platelets 150.0 - 400.0 K/uL 306.0  307  300        Latest Ref Rng & Units 09/18/2023    4:16 PM 10/07/2020    9:58 AM 03/15/2020    3:23 PM  BMP  Glucose 70 - 99 mg/dL 98  97  93   BUN 6 - 23 mg/dL 5  11  9    Creatinine 0.40 - 1.50 mg/dL 4.09  8.11  9.14   BUN/Creat Ratio 9 - 20  10  9    Sodium 135 - 145 mEq/L  139  140  140   Potassium 3.5 - 5.1 mEq/L 4.0  4.4  4.0   Chloride 96 - 112 mEq/L 104  101  103   CO2 19 - 32 mEq/L 27  23  23    Calcium 8.4 - 10.5 mg/dL 9.2  9.7  9.1     BNP No results found for: "BNP"  ProBNP No results found for: "PROBNP"  PFT No results found for: "FEV1PRE", "FEV1POST", "FVCPRE", "FVCPOST", "TLC", "DLCOUNC", "PREFEV1FVCRT", "PSTFEV1FVCRT"  No results found.   Past medical hx Past Medical History:  Diagnosis Date   Headaches, cluster    Hypertension    Smoker      Social History   Tobacco Use   Smoking status: Every Day    Current packs/day: 1.00    Average packs/day: 0.9 packs/day for 25.0 years  (22.8 ttl pk-yrs)    Types: Cigarettes, E-cigarettes   Smokeless tobacco: Never   Tobacco comments:    09/25/21 4 a day  Vaping Use   Vaping status: Never Used  Substance Use Topics   Alcohol use: No   Drug use: Yes    Types: Marijuana    Mr.Oleski reports that he has been smoking cigarettes and e-cigarettes. He has a 22.8 pack-year smoking history. He has never used smokeless tobacco. He reports current drug use. Drug: Marijuana. He reports that he does not drink alcohol.  Tobacco Cessation: Ready to quit: Not Answered Counseling given: Not Answered Tobacco comments: 09/25/21 4 a day Quit smoking 2 days ago. 22 pack year smoking history.  Past surgical hx, Family hx, Social hx all reviewed.  Current Outpatient Medications on File Prior to Visit  Medication Sig   aspirin EC 81 MG tablet Take 1 tablet (81 mg total) by mouth daily. Swallow whole.   Atogepant (QULIPTA) 60 MG TABS Take 1 tablet (60 mg total) by mouth daily.   losartan (COZAAR) 25 MG tablet Take 1 tablet (25 mg total) by mouth daily.   Rimegepant Sulfate (NURTEC) 75 MG TBDP Take 1 tablet by mouth at the onset of migraine. Only 1 tablet in 24 hours.   rosuvastatin (CRESTOR) 40 MG tablet Take 1 tablet (40 mg total) by mouth daily.   nicotine polacrilex (NICORETTE) 4 MG gum On weeks 10-12: Use 1 piece by mouth as directed every 4-8 hours. Max 24 pieces per day. (Patient not taking: Reported on 02/14/2024)   [DISCONTINUED] cetirizine (ZYRTEC) 10 MG tablet Take 10 mg by mouth daily.   [DISCONTINUED] diphenhydramine-acetaminophen (TYLENOL PM) 25-500 MG TABS tablet Take 1 tablet by mouth at bedtime as needed.   No current facility-administered medications on file prior to visit.     No Known Allergies  Review Of Systems:  Constitutional:   No  weight loss, night sweats,  Fevers, chills, ++fatigue, or  lassitude.  HEENT:   + headaches,  Difficulty swallowing,  Tooth/dental problems, or  Sore throat,                No  sneezing, itching, ear ache, ++nasal congestion, post nasal drip,   CV:  + chronic  chest pain,  No Orthopnea, PND, swelling in lower extremities, anasarca, dizziness, palpitations, syncope.   GI  No heartburn, indigestion, abdominal pain, nausea, vomiting, diarrhea, change in bowel habits, loss of appetite, bloody stools.   Resp: + shortness of breath with exertion less at rest.  No excess mucus, no productive cough,  No non-productive cough,  No coughing up of blood.  No change in  color of mucus.  No wheezing.  No chest wall deformity  Skin: no rash or lesions.  GU: no dysuria, change in color of urine, no urgency or frequency.  No flank pain, no hematuria   MS:  No joint pain or swelling.  No decreased range of motion.  No back pain.  Psych:  No change in mood or affect. No depression or anxiety.  No memory loss.   Vital Signs BP 132/88 (BP Location: Left Arm, Patient Position: Sitting, Cuff Size: Large)   Pulse 78   Ht 5\' 8"  (1.727 m)   Wt 184 lb 3.2 oz (83.6 kg)   SpO2 98%   BMI 28.01 kg/m    Physical Exam:  General- No distress,  A&Ox3, pleasant ENT: No sinus tenderness, TM clear, pale nasal mucosa, no oral exudate,no post nasal drip, no LAN Cardiac: S1, S2, regular rate and rhythm, no murmur Chest: No wheeze/ rales/ dullness; no accessory muscle use, no nasal flaring, no sternal retractions Abd.: Soft Non-tender, ND, BS +, Body mass index is 28.01 kg/m.  Ext: No clubbing cyanosis, edema, no obvious deformities Neuro:  normal strength, MAE x 4, A&O x 3 Skin: No rashes, warm and dry, no obvious lesions  Psych: normal mood and behavior   Assessment/Plan Lung Nodule Small pulmonary nodule (3.53mm) on left upper lobe noted on previous imaging in October 2024. No symptoms of weight loss, hemoptysis, or change in cough. -Schedule follow-up CT chest in October 2025 to monitor nodule.  Shortness of Breath Shortness of breath with exertion, history of smoking, and possible  COPD/emphysema. No wheezing noted on examination. -Order Pulmonary Function Tests to confirm diagnosis of COPD or possible asthma. -Prescribe Albuterol inhaler, 1-2 puffs as needed for shortness of breath or wheezing. If more than 6 puffs a day are needed, patient should call to be seen. -Consider maintenance inhaler after PFT's have been resulted.  Decreased Energy Recent decrease in energy and change in temperature tolerance. No significant weight loss. -Recommend primary care provider check CBC and possibly testosterone level at next lab work to rule out anemia or low testosterone as cause of symptoms.  Tobacco Use Recent switch from cigarettes to vaping. Increased appetite since cessation of smoking. -Encourage continued cessation of smoking. Suggest sugar-free candies or flavored toothpicks to help with oral fixation and increased appetite.  Family History of Cancer Significant family history of various cancers, including small cell lung cancer and prostate cancer in grandfather, breast cancer in maternal aunts, and unspecified cancer in an aunt. -Continue regular screenings as appropriate for age and risk factors.  Follow-up -Follow-up after Pulmonary Function Tests and again after CT chest in October 2025.  I spent 35 minutes dedicated to the care of this patient on the date of this encounter to include pre-visit review of records, face-to-face time with the patient discussing conditions above, post visit ordering of testing, clinical documentation with the electronic health record, making appropriate referrals as documented, and communicating necessary information to the patient's healthcare team.     Bevelyn Ngo, NP 02/14/2024  10:32 AM

## 2024-02-24 ENCOUNTER — Other Ambulatory Visit (HOSPITAL_BASED_OUTPATIENT_CLINIC_OR_DEPARTMENT_OTHER): Payer: Self-pay

## 2024-02-26 ENCOUNTER — Institutional Professional Consult (permissible substitution): Payer: Commercial Managed Care - PPO | Admitting: Pulmonary Disease

## 2024-04-06 ENCOUNTER — Ambulatory Visit (HOSPITAL_COMMUNITY)
Admission: RE | Admit: 2024-04-06 | Discharge: 2024-04-06 | Disposition: A | Discharge status: Home / Self Care | Source: Ambulatory Visit | Attending: Acute Care | Admitting: Acute Care

## 2024-04-06 DIAGNOSIS — J439 Emphysema, unspecified: Secondary | ICD-10-CM | POA: Insufficient documentation

## 2024-04-06 LAB — PULMONARY FUNCTION TEST
DL/VA % pred: 109 %
DL/VA: 5.06 ml/min/mmHg/L
DLCO unc % pred: 85 %
DLCO unc: 24.46 ml/min/mmHg
FEF 25-75 Post: 0.54 L/s
FEF 25-75 Pre: 2.69 L/s
FEF2575-%Change-Post: -79 %
FEF2575-%Pred-Post: 14 %
FEF2575-%Pred-Pre: 73 %
FEV1-%Change-Post: -70 %
FEV1-%Pred-Post: 21 %
FEV1-%Pred-Pre: 72 %
FEV1-Post: 0.84 L
FEV1-Pre: 2.82 L
FEV1FVC-%Change-Post: -69 %
FEV1FVC-%Pred-Pre: 98 %
FEV6-%Change-Post: -10 %
FEV6-%Pred-Post: 67 %
FEV6-%Pred-Pre: 75 %
FEV6-Post: 3.21 L
FEV6-Pre: 3.6 L
FEV6FVC-%Change-Post: -3 %
FEV6FVC-%Pred-Post: 99 %
FEV6FVC-%Pred-Pre: 102 %
FVC-%Change-Post: -3 %
FVC-%Pred-Post: 71 %
FVC-%Pred-Pre: 73 %
FVC-Post: 3.48 L
FVC-Pre: 3.6 L
Post FEV1/FVC ratio: 24 %
Post FEV6/FVC ratio: 96 %
Pre FEV1/FVC ratio: 78 %
Pre FEV6/FVC Ratio: 100 %
RV % pred: 333 %
RV: 5.94 L
TLC % pred: 148 %
TLC: 9.69 L

## 2024-04-06 MED ORDER — ALBUTEROL SULFATE (2.5 MG/3ML) 0.083% IN NEBU
2.5000 mg | INHALATION_SOLUTION | Freq: Once | RESPIRATORY_TRACT | Status: AC
  Administered 2024-04-06: 2.5 mg via RESPIRATORY_TRACT

## 2024-07-24 ENCOUNTER — Telehealth: Payer: Self-pay

## 2024-07-24 ENCOUNTER — Other Ambulatory Visit (HOSPITAL_COMMUNITY): Payer: Self-pay

## 2024-07-24 NOTE — Telephone Encounter (Signed)
 It is time to renew PA-PT has not been evaluated since starting medication and insurance requires documentation on how the medication is working for the PT. Please advise.

## 2024-07-27 NOTE — Telephone Encounter (Signed)
 Spoke with patient and got him scheduled for OV this Thursday 07/30/24 at 8:30 AM, asked for check-in at 8:15 AM. Pt said he drops his wife off at work at 8 and will probably check-in right at 830. He is aware that if arriving after 8:30-8:35 we may not be able to see him.

## 2024-07-30 ENCOUNTER — Encounter: Payer: Self-pay | Admitting: Adult Health

## 2024-07-30 ENCOUNTER — Ambulatory Visit: Admitting: Adult Health

## 2024-07-30 NOTE — Telephone Encounter (Signed)
 PT did not come in for his appointment-I will archive PA.

## 2024-10-01 ENCOUNTER — Encounter: Payer: Commercial Managed Care - PPO | Admitting: Internal Medicine

## 2024-10-02 ENCOUNTER — Ambulatory Visit: Admitting: Internal Medicine

## 2024-10-02 ENCOUNTER — Encounter: Payer: Self-pay | Admitting: Internal Medicine

## 2024-10-02 VITALS — BP 132/70 | HR 80 | Temp 98.1°F | Ht 68.0 in | Wt 179.0 lb

## 2024-10-02 DIAGNOSIS — I2584 Coronary atherosclerosis due to calcified coronary lesion: Secondary | ICD-10-CM

## 2024-10-02 DIAGNOSIS — Z809 Family history of malignant neoplasm, unspecified: Secondary | ICD-10-CM

## 2024-10-02 DIAGNOSIS — E785 Hyperlipidemia, unspecified: Secondary | ICD-10-CM | POA: Diagnosis not present

## 2024-10-02 DIAGNOSIS — I2510 Atherosclerotic heart disease of native coronary artery without angina pectoris: Secondary | ICD-10-CM

## 2024-10-02 DIAGNOSIS — J439 Emphysema, unspecified: Secondary | ICD-10-CM | POA: Diagnosis not present

## 2024-10-02 DIAGNOSIS — R911 Solitary pulmonary nodule: Secondary | ICD-10-CM

## 2024-10-02 DIAGNOSIS — Z0001 Encounter for general adult medical examination with abnormal findings: Secondary | ICD-10-CM | POA: Diagnosis not present

## 2024-10-02 DIAGNOSIS — F17200 Nicotine dependence, unspecified, uncomplicated: Secondary | ICD-10-CM

## 2024-10-02 LAB — COMPREHENSIVE METABOLIC PANEL WITH GFR
ALT: 14 U/L (ref 0–53)
AST: 19 U/L (ref 0–37)
Albumin: 4.1 g/dL (ref 3.5–5.2)
Alkaline Phosphatase: 62 U/L (ref 39–117)
BUN: 8 mg/dL (ref 6–23)
CO2: 27 meq/L (ref 19–32)
Calcium: 8.9 mg/dL (ref 8.4–10.5)
Chloride: 104 meq/L (ref 96–112)
Creatinine, Ser: 0.95 mg/dL (ref 0.40–1.50)
GFR: 97.89 mL/min (ref >60.00)
Glucose, Bld: 103 mg/dL — ABNORMAL HIGH (ref 70–99)
Potassium: 3.9 meq/L (ref 3.5–5.1)
Sodium: 139 meq/L (ref 135–145)
Total Bilirubin: 0.4 mg/dL (ref 0.2–1.2)
Total Protein: 6.3 g/dL (ref 6.0–8.3)

## 2024-10-02 LAB — LIPID PANEL
Cholesterol: 184 mg/dL (ref 0–200)
HDL: 27.4 mg/dL — ABNORMAL LOW (ref >39.00)
LDL Cholesterol: 128 mg/dL — ABNORMAL HIGH (ref 0–99)
NonHDL: 156.13
Total CHOL/HDL Ratio: 7
Triglycerides: 140 mg/dL (ref 0.0–149.0)
VLDL: 28 mg/dL (ref 0.0–40.0)

## 2024-10-02 LAB — CBC WITH DIFFERENTIAL/PLATELET
Basophils Absolute: 0.1 K/uL (ref 0.0–0.1)
Basophils Relative: 0.6 % (ref 0.0–3.0)
Eosinophils Absolute: 0.2 K/uL (ref 0.0–0.7)
Eosinophils Relative: 1.6 % (ref 0.0–5.0)
HCT: 43.5 % (ref 39.0–52.0)
Hemoglobin: 14.4 g/dL (ref 13.0–17.0)
Lymphocytes Relative: 30.6 % (ref 12.0–46.0)
Lymphs Abs: 3.1 K/uL (ref 0.7–4.0)
MCHC: 33.2 g/dL (ref 30.0–36.0)
MCV: 92.1 fl (ref 78.0–100.0)
Monocytes Absolute: 0.5 K/uL (ref 0.1–1.0)
Monocytes Relative: 4.7 % (ref 3.0–12.0)
Neutro Abs: 6.4 K/uL (ref 1.4–7.7)
Neutrophils Relative %: 62.5 % (ref 43.0–77.0)
Platelets: 334 K/uL (ref 150.0–400.0)
RBC: 4.72 Mil/uL (ref 4.22–5.81)
RDW: 13.8 % (ref 11.5–15.5)
WBC: 10.2 K/uL (ref 4.0–10.5)

## 2024-10-02 NOTE — Patient Instructions (Addendum)
 Team Member Role and Specialty Contact Info Address Start End Comments  Delford Maude BROCKS, MD Consulting Physician (Cardiology) Phone: [phone] Fax: [phone] [address] [place] [address] 10/02/2024 - -    VISIT SUMMARY: Today, you had your annual physical exam. We discussed your ongoing health concerns, including your heart disease risk, high blood pressure, high cholesterol, smoking habits, lung nodule, weight, spinal fusion, and migraines. We also reviewed your current medications and lifestyle habits.  YOUR PLAN: -CORONARY ARTERY DISEASE: Coronary artery disease is a condition where the blood vessels supplying your heart are narrowed or blocked. We will order cholesterol lab tests and may prescribe Nexletol if your LDL cholesterol is not under 70. It's important to continue working on quitting smoking to reduce your heart attack risk.  -HYPERTENSION: Hypertension, or high blood pressure, can increase your risk of heart disease. Your blood pressure today was 132/70, which is borderline. Please monitor your blood pressure at home and let us  know if it averages above 130.  -HYPERLIPIDEMIA: Hyperlipidemia means you have high levels of fats in your blood, which can lead to heart disease. We will order cholesterol lab tests and may prescribe Nexletol if your LDL cholesterol is not under 70.  -TOBACCO USE DISORDER: Tobacco use disorder means you are dependent on smoking, which poses significant health risks. We encourage you to quit smoking completely. Continue using nicotine  gum and lozenges as needed, and consider medications like Chantix or Wellbutrin for additional support.  -PULMONARY NODULE, RIGHT LUNG: A pulmonary nodule is a small growth in the lung that can potentially become cancerous. You have a CT scan scheduled for October 15 to monitor the nodule. Quitting smoking is crucial to reduce the risk of nodule growth.  -OBESITY: Obesity means having excess body weight. While  it's not our primary concern today, maintaining a healthy diet and regular exercise is important.  -CERVICAL SPINAL FUSION: Cervical spinal fusion is a surgery to join two or more vertebrae in your neck. To prevent complications, take calcium  with vitamin D and K2 supplements and avoid activities that strain your neck.  -MIGRAINE: Migraines are severe headaches. Your migraines are well-controlled with your current management plan.  -ADULT WELLNESS VISIT: We conducted your annual wellness visit, noting improvements in your cholesterol and blood pressure. Routine lab work will be ordered to monitor your overall health.  INSTRUCTIONS: Please follow up with the CT scan on October 15 to monitor your lung nodule. Continue to monitor your blood pressure at home and report any averages above 130. We will contact you with the results of your cholesterol lab tests and discuss any necessary changes to your medication. If you need additional support to quit smoking, consider medications like Chantix or Wellbutrin. Maintain a healthy diet and regular exercise, and take your calcium  with vitamin D and K2 supplements as prescribed.  Building Your Long-Term Health Plan  During today's preventive visit, we covered a variety of important health checks to help you stay on top of your well-being.  We also discussed strategies to maintain your health and identified some areas that might benefit from further exploration.   Preventive care visits like today's are designed to be proactive, but sometimes additional attention may be needed.  Rest assured, we're here for you.  If these areas require further evaluation or management, we'd be happy to schedule a separate, focused appointment to address them in detail.  Addressing Next Steps  [x]   Follow-up Visit: To ensure we address any unresolved issues and continue  monitoring your overall health, we recommend scheduling a follow-up appointment in 1 year for your next  preventive care visit. If you experience any new problems, need to discuss any medical concerns, or your condition worsens before then, please don't hesitate to call our office to schedule an appointment or seek emergency care as needed.  [x]   Preventive Measures: Maintaining healthy habits plays a crucial role in overall wellness. We recommend considering these tips: [x]   Regular appointments with dental and vision professionals [x]   Nightly nasal saline mist to keep sinuses clear [x]   Consistent toothbrushing to maintain oral health [x]   Using an app like SnoreLab to track sleep quality [x]   Routine checks of blood pressure and heart rate [x]   Medical Information: In some instances, we may require additional medical information from other providers to create a comprehensive picture of your health. If applicable, we can provide a medical information release form at the front desk for you to sign, allowing us  to gather these records. [x]   Lab Tests: If any lab tests were ordered today, scheduling them within a week of your visit helps ensure the best possible insurance coverage.  Planning Follow Up to Work on a Problem? Make the Most of Our Focused (20 minute) Appointments  [x]   Clearly state your top concerns at the beginning of the visit to focus our discussion [x]   If you anticipate you will need more time, please inform the front desk during scheduling - we can book multiple appointments in the same week. [x]   If you have transportation problems- use our convenient video appointments or ask about transportation support. [x]   We can get down to business faster if you use MyChart to update information before the visit and submit non-urgent questions before your visit. Thank you for taking the time to provide details through MyChart.  Let our nurse know and she can import this information into your encounter documents.  Arrival and Wait Times  [x]   Arriving on time ensures that everyone receives  prompt attention. [x]   Early morning (8a) and afternoon (1p) appointments tend to have shortest wait times. [x]   Unfortunately, we cannot delay appointments for late arrivals or hold slots during phone calls.  Bring to Your Next Appointment:  [x]   Medications: Please bring all your medication bottles to your next appointment to ensure we have an accurate record of your prescriptions. [x]   Health Diaries: If you're monitoring any health conditions at home, keeping a diary of your readings can be very helpful for discussions at your next appointment.  Reviewing Your Records  [x]   Review your attached preventive care information at the end of these patient instructions. [x]   Review this early draft of your clinical encounter notes below and the final encounter summary tomorrow on MyChart after its been completed.      Getting Answers and Following Up  [x]   Simple Questions & Concerns: For quick questions or basic follow-up after your visit, reach us  at [phone] or MyChart messaging. [x]   Complex Concerns: If your concern is more complex, scheduling an appointment might be best. Discuss this with the staff to find the most suitable option. [x]   Lab & Imaging Results: We'll contact you directly if results are abnormal or you don't use MyChart. Most normal results will be on MyChart within 2-3 business days, with a review message from Dr. Jesus. Haven't heard back in 2 weeks? Need results sooner? Contact us  at [phone]. [x]   Referrals: Our referral coordinator will manage  specialist referrals. The specialist's office should contact you within 2 weeks to schedule an appointment. Call us  if you haven't heard from them after 2 weeks.  Staying Connected  [x]   MyChart: Activate your MyChart for the fastest way to access results and message us . See the last page of this paperwork for instructions on how to activate.  Billing  [x]   X-ray & Lab Orders: These are billed by separate  companies. Contact the invoicing company directly for questions or concerns. [x]   Visit Charges: Discuss any billing inquiries with our administrative services team.  Your Satisfaction Matters  [x]   Share Your Experience: We strive for your satisfaction! If you have any complaints, or preferably compliments, please let Dr. Jesus know directly or contact our Practice Administrators, Manuelita Rubin or [employer], by asking at the front desk.                 Next Steps  [x]   Schedule Follow-Up:  We recommend a follow-up appointment in 1 year for your next wellness visit.  If you develop any new problems, want to address any medical issues, or your condition worsens before then, please call us  for an appointment or seek emergency care. [x]   Preventive Care:  Make sure to keep regular appointments with dental and vision professionals, use nightly nasal saline mist sprays to keep your sinuses clear and toothbrushing to protect your teeth. Use SnoreLab App or other app to track your sleep quality. Check blood pressure and heart rate routinely. [x]   Medical Information Release:  For any relevant medical information we don't have, please sign a release form at the front desk so we can obtain it for your records. [x]   Lab Tests:  Schedule any lab tests from today for within a week to ensure best insurance coverage.    Making the Most of Our Focused (20 minute) Appointments:  [x]   Clearly state your top concerns at the beginning of the visit to focus our discussion [x]   If you anticipate you will need more time, please inform the front desk during scheduling - we can book multiple appointments in the same week. [x]   If you have transportation problems- use our convenient video appointments or ask about transportation support. [x]   We can get down to business faster if you use MyChart to update information before the visit and submit non-urgent questions before your visit. Thank you for  taking the time to provide details through MyChart.  Let our nurse know and she can import this information into your encounter documents.  Arrival and Wait Times: [x]   Arriving on time ensures that everyone receives prompt attention. [x]   Early morning (8a) and afternoon (1p) appointments tend to have shortest wait times. [x]   Unfortunately, we cannot delay appointments for late arrivals or hold slots during phone calls.  Bring to Your Next Appointment  [x]   Medications: Please bring all your medication bottles to your next appointment to ensure we have an accurate record of your prescriptions. [x]   Health Diaries: If you're monitoring any health conditions at home, keeping a diary of your readings can be very helpful for discussions at your next appointment.  Reviewing Your Records  [x]   Review your attached preventive care information at the end of these patient instructions. [x]   Review this early draft of your clinical encounter notes below and the final encounter summary tomorrow on MyChart after its been completed.   Encounter for annual general medical examination with abnormal findings in adult -  Lipid panel -     Comprehensive metabolic panel with GFR -     CBC with Differential/Platelet  Dyslipidemia  Coronary artery disease due to calcified coronary lesion  Emphysema lung (HCC)  FH: cancer  Lung nodule seen on imaging study  Smoker     Getting Answers and Following Up  [x]   Simple Questions & Concerns: For quick questions or basic follow-up after your visit, reach us  at [phone] or MyChart messaging. [x]   Complex Concerns: If your concern is more complex, scheduling an appointment might be best. Discuss this with the staff to find the most suitable option. [x]   Lab & Imaging Results: We'll contact you directly if results are abnormal or you don't use MyChart. Most normal results will be on MyChart within 2-3 business days, with a review message from Dr.  Jesus. Haven't heard back in 2 weeks? Need results sooner? Contact us  at [phone]. [x]   Referrals: Our referral coordinator will manage specialist referrals. The specialist's office should contact you within 2 weeks to schedule an appointment. Call us  if you haven't heard from them after 2 weeks.  Staying Connected  [x]   MyChart: Activate your MyChart for the fastest way to access results and message us . See the last page of this paperwork for instructions on how to activate.  Billing  [x]   X-ray & Lab Orders: These are billed by separate companies. Contact the invoicing company directly for questions or concerns. [x]   Visit Charges: Discuss any billing inquiries with our administrative services team.  Your Satisfaction Matters  [x]   Share Your Experience: We strive for your satisfaction! If you have any complaints, or preferably compliments, please let Dr. Jesus know directly or contact our Practice Administrators, Manuelita Rubin or [employer], by asking at the front desk.    Medical Screening Exam A medical screening exam (MSE) helps to determine whether you need immediate medical treatment relating to any number of symptoms you are having. This type of exam may be done in an emergency department, an urgent care setting, or your health care provider's office. Depending on your symptoms and severity, you may need additional tests or medical therapy. It is important to note that an MSE does not necessarily mean that you will need or receive further medical testing or interventions if your symptoms are not deemed to be medically urgent (emergent). Tell a health care provider about: Any allergies you have. All medicines you are taking, including vitamins, herbs, eye drops, creams, and over-the-counter medicines. Any problems you or family members have had with anesthetic medicines. Any bleeding problems you have. Any surgeries you have had. Any medical conditions you  have. Whether you are pregnant or may be pregnant. What happens during the test? During the exam, a health care provider does a short, often focused, physical exam and asks about your medical history to assess: Your current symptoms. Your overall health. Your need for possible further medical intervention. What can I expect after the test? If you have a regular health care provider, make an appointment for a follow-up visit with him or her. If you do not have a regular health care provider, ask about resources in your community. Your medical screening exam may determine that: You do not need emergency treatment at this time. You need treatment right away. You need to be transferred to another medical center. This may happen if you need an emergent specialist or consultant that is not available at the medical center you are at. You  need to have more tests. A medical specialist may be consulted if needed. Get help right away if: Your condition gets worse. You develop new or troubling symptoms before you see your health care provider. These symptoms may represent a serious problem that is an emergency. Do not wait to see if the symptoms will go away. Get medical help right away. Call your local emergency services (911 in the [place] Do not drive yourself to the hospital. Summary A medical screening exam helps to determine whether you need medical treatment right away. This type of exam may be done in an emergency department, an urgent care setting, or your health care provider's office. During the exam, a health care provider does a short physical exam and asks about your current symptoms and overall health. Depending on the exam, more tests or therapies may be ordered. However, an MSE does not necessarily mean that you will have further medical testing if your symptoms are not deemed to be urgent. If you need further care that is not offered at your current medical center, you may need to be  transferred to another facility. This information is not intended to replace advice given to you by your health care provider. Make sure you discuss any questions you have with your health care provider. Document Revised: 08/23/2021 Document Reviewed: 04/20/2021 Elsevier Patient Education  2024 [employer].

## 2024-10-02 NOTE — Assessment & Plan Note (Signed)
 Comprehensive preventive visit completed today. History, meds, allergies, immunizations, family and social risks reviewed; vitals and full exam documented. Counseling on nutrition, activity, sleep, mental health, sexual health, substance use, and safety. Vaccinations reconciled and updated per [employer] (influenza annually; COVID seasonal; Tdap q10y; shingles >=50; pneumococcal per age/comorbidity; Hep B as indicated). Screening plan set per [employer]PST[facility]: BP/BMI each visit; lipids/diabetes per risk; low-dose CT for eligible smokers discussed but he doesn't meet age requirement 50+ for insurance coverage. Follow-up annually or sooner for abnormalities.   Coronary artery disease   Coronary artery disease is due to a calcified coronary lesion with a 10% risk of heart attack in the next ten years. Previous stress test results were good. Order cholesterol lab tests. Consider prescribing Nexletol if LDL cholesterol is not under 70. Encourage smoking cessation to reduce heart attack risk.  Hypertension   Blood pressure was 132/70, borderline for someone with heart disease. Monitor blood pressure at home and report to the office if it averages above 130.  Hyperlipidemia   Hyperlipidemia with previous high LDL levels contributing to early heart disease. Order cholesterol lab tests. Consider prescribing Nexletol if LDL cholesterol is not under 70.  Tobacco use disorder   He continues to smoke 6-8 cigarettes per day, posing significant health risks including heart disease and cancer. Encourage complete smoking cessation. Use nicotine  gum and lozenges as needed. Consider Chantix or Wellbutrin if additional support is needed.  Pulmonary nodule, right lung   A pulmonary nodule previously measured at 3.8 cm. There is potential for nodules to become cancerous with continued smoking. Perform a CT scan on October 15 to monitor nodule size. Encourage smoking cessation to reduce the risk of nodule growth.  Obesity   Weight gain  is noted but is not a primary concern compared to smoking cessation. Encourage a healthy diet and regular exercise.  Cervical spinal fusion   Cervical spinal fusion with titanium spacers and screws presents potential for adjacent segment disease. Prescribe calcium  with vitamin D and K2 supplementation. Advise on precautions to avoid neck strain.  Migraine   Migraines are well-controlled with current management.  Adult Wellness Visit   An annual wellness visit was conducted. Improvements in cholesterol and blood pressure control were noted. Perform an annual physical examination and order routine lab work.  Recording duration: 28 minutes

## 2024-10-02 NOTE — Progress Notes (Signed)
 [facility] [address] [place], [place] [address] Office:  [phone]  -- Annual Preventive Medical Office Visit --  Patient:  Gabriel Petty      Age: 43 y.o.       Sex:  male  Date:   10/02/2024 Patient Care Team: Jesus Bernardino MATSU, MD as PCP - General (Internal Medicine) Delford Maude BROCKS, MD as Consulting Physician (Cardiology) Today's Healthcare Provider: Bernardino MATSU Jesus, MD  ========================================= Chief complaint: Annual Exam (Non fasting w/ labs)  Purpose of Visit: Comprehensive preventive health assessment and personalized health maintenance planning.  This encounter was conducted as a Comprehensive Physical Exam (CPE) preventive care annual visit. The patient's medical history and problem list were reviewed to inform individualized preventive care recommendations.   No problem-specific medical treatment was provided during this visit.  Assessment & Plan Encounter for annual general medical examination with abnormal findings in adult Dyslipidemia Coronary artery disease due to calcified coronary lesion Emphysema lung (HCC) FH: cancer Lung nodule seen on imaging study Smoker Comprehensive preventive visit completed today. History, meds, allergies, immunizations, family and social risks reviewed; vitals and full exam documented. Counseling on nutrition, activity, sleep, mental health, sexual health, substance use, and safety. Vaccinations reconciled and updated per ACIP (influenza annually; COVID seasonal; Tdap q10y; shingles >=50; pneumococcal per age/comorbidity; Hep B as indicated). Screening plan set per USPSTF: BP/BMI each visit; lipids/diabetes per risk; low-dose CT for eligible smokers discussed but he doesn't meet age requirement 50+ for insurance coverage. Follow-up annually or sooner for abnormalities.   Coronary artery disease   Coronary artery disease is due to a calcified coronary lesion with a 10% risk of heart  attack in the next ten years. Previous stress test results were good. Order cholesterol lab tests. Consider prescribing Nexletol if LDL cholesterol is not under 70. Encourage smoking cessation to reduce heart attack risk.  Hypertension   Blood pressure was 132/70, borderline for someone with heart disease. Monitor blood pressure at home and report to the office if it averages above 130.  Hyperlipidemia   Hyperlipidemia with previous high LDL levels contributing to early heart disease. Order cholesterol lab tests. Consider prescribing Nexletol if LDL cholesterol is not under 70.  Tobacco use disorder   He continues to smoke 6-8 cigarettes per day, posing significant health risks including heart disease and cancer. Encourage complete smoking cessation. Use nicotine  gum and lozenges as needed. Consider Chantix or Wellbutrin if additional support is needed.  Pulmonary nodule, right lung   A pulmonary nodule previously measured at 3.8 cm. There is potential for nodules to become cancerous with continued smoking. Perform a CT scan on October 15 to monitor nodule size. Encourage smoking cessation to reduce the risk of nodule growth.  Obesity   Weight gain is noted but is not a primary concern compared to smoking cessation. Encourage a healthy diet and regular exercise.  Cervical spinal fusion   Cervical spinal fusion with titanium spacers and screws presents potential for adjacent segment disease. Prescribe calcium  with vitamin D and K2 supplementation. Advise on precautions to avoid neck strain.  Migraine   Migraines are well-controlled with current management.  Adult Wellness Visit   An annual wellness visit was conducted. Improvements in cholesterol and blood pressure control were noted. Perform an annual physical examination and order routine lab work.  Recording duration: 28 minutes    ICD-10-CM   1. Encounter for annual general medical examination with abnormal findings in adult  Z00.01  Lipid  panel    Comprehensive metabolic panel with GFR    CBC with Differential/Platelet    2. Dyslipidemia  E78.5     3. Coronary artery disease due to calcified coronary lesion  I25.10    I25.84     4. Emphysema lung (HCC)  J43.9     5. FH: cancer  Z80.9     6. Lung nodule seen on imaging study  R91.1     7. Smoker  F17.200       Reviewed/updated/encouraged completion: Immunization History  Administered Date(s) Administered   Tdap 01/31/2017   There are no preventive care reminders to display for this patient. Health Maintenance  Topic Date Due   Influenza Vaccine  03/23/2025 (Originally 07/24/2024)   Pneumococcal Vaccine (1 of 2 - PCV) 10/02/2025 (Originally 01/16/2000)   Hepatitis B Vaccines 19-59 Average Risk (1 of 3 - 19+ 3-dose series) 10/02/2025 (Originally 01/16/2000)   HPV VACCINES (1 - 3-dose SCDM series) 10/02/2025 (Originally 01/16/2008)   DTaP/Tdap/Td (2 - Td or Tdap) 01/31/2027   Hepatitis C Screening  Completed   HIV Screening  Completed   Meningococcal B Vaccine  Aged Out   COVID-19 Vaccine  Discontinued    Reviewed the following verbally with patient and provided AVS materials:   HEALTH MAINTENANCE COUNSELING AND ANTICIPATORY GUIDANCE    Preventive Measure Recommendation  Eye Exams Every 1-2 years  Dental Care Cleanings every 6 months or more, brush/floss 3x daily  Sinus Care Saline spray rinses daily  Sleep 8 hours nightly, good sleep hygiene, e-monitoring if any daytime drowsiness  Diet Fruits/vegetables/fiber/healthy fats, balance and moderation  Exercise 150 minutes weekly  Risk Behaviors Discouraged any/all high risk behaviors    CANCER SCREENING SHARED DECISION MAKING    Penile/Testicle/Scrotum Encouraged self-monitoring and reporting of genital abnormalities. Patient reports none.  Thyroid Checked and advised to palpate thyroid for nodules nodular areas outside wing under 1 cm.  Prostate Individualized risks/benefits/costs discussed Lab  Results  Component Value Date   PSA 0.99 10/31/2023    Colon HM Colonoscopy   This patient has no relevant Health Maintenance data.     Lung Current guidelines recommend individuals aged 9 to 39 who currently smoke or formerly smoked and have a >= 20 pack-year smoking history should undergo annual screening with low-dose computed tomography (LDCT). Tobacco Use: High Risk (10/02/2024)   Patient History    Smoking Tobacco Use: Every Day    Smokeless Tobacco Use: Never    Passive Exposure: Not on file   Social History   Tobacco Use  Smoking Status Every Day   Current packs/day: 1.00   Average packs/day: 0.9 packs/day for 25.0 years (22.8 ttl pk-yrs)   Types: Cigarettes, E-cigarettes  Smokeless Tobacco Never  Tobacco Comments   09/25/21 4 a day    Skin Advised regular sunscreen use. Patient denies worrisome, changing, or new skin lesions.     Discussed the use of AI scribe software for clinical note transcription with the patient, who gave verbal consent to proceed.  History of Present Illness  43 year old male who presents for an annual physical exam.  He is scheduled for a CT scan of a lung nodule next Friday. He underwent spinal fusion surgery a few years ago, which has remained stable.  He continues to smoke, though he has reduced his cigarette consumption to about six cigarettes at work and two at home. He uses a vape and nicotine  gum to help reduce smoking and has tried lozenges but finds  them less convenient.  He has a history of high cholesterol and heart disease risk, with previous stress tests showing good results. He follows a diet that includes chicken, [place], pasta, and vegetables, and uses extra virgin olive oil in cooking. He consumes eggs daily but avoids dairy products, cheese, and sweets. He exercises regularly at work, which involves physical activity for nine hours a day.  He has a history of migraines, which have improved significantly with changes in his  habits, diet, and exercise regimen. He reports fewer headaches than before.  He is currently taking aspirin , losartan , Nurtec, rosuvastatin , and uses nicotine  gum. He manages his medication schedule with a pill box.  ROS A comprehensive ROS was negative for any concerning symptoms.   Completed medication reconciliation: Current Outpatient Medications on File Prior to Visit  Medication Sig   albuterol  (VENTOLIN  HFA) 108 (90 Base) MCG/ACT inhaler Inhale 1-2 puffs into the lungs every 6 (six) hours as needed for wheezing or shortness of breath.   aspirin  EC 81 MG tablet Take 1 tablet (81 mg total) by mouth daily. Swallow whole.   Atogepant  (QULIPTA ) 60 MG TABS Take 1 tablet (60 mg total) by mouth daily.   losartan  (COZAAR ) 25 MG tablet Take 1 tablet (25 mg total) by mouth daily.   Rimegepant Sulfate  (NURTEC) 75 MG TBDP Take 1 tablet by mouth at the onset of migraine. Only 1 tablet in 24 hours.   rosuvastatin  (CRESTOR ) 40 MG tablet Take 1 tablet (40 mg total) by mouth daily.   nicotine  polacrilex (NICORETTE ) 4 MG gum On weeks 10-12: Use 1 piece by mouth as directed every 4-8 hours. Max 24 pieces per day. (Patient not taking: Reported on 02/14/2024)   [DISCONTINUED] cetirizine (ZYRTEC) 10 MG tablet Take 10 mg by mouth daily.   [DISCONTINUED] diphenhydramine-acetaminophen  (TYLENOL  PM) 25-500 MG TABS tablet Take 1 tablet by mouth at bedtime as needed.   No current facility-administered medications on file prior to visit.  There are no discontinued medications.The following were reviewed and/or entered/updated into our electronic MEDICAL RECORD NUMBERPast Medical History:  Diagnosis Date   Headaches, cluster    Hypertension    Smoker    Past Surgical History:  Procedure Laterality Date   arm surgery  2003   plate, screws R arm   CERVICAL FUSION  11/15/2020   C5-7   SPINE SURGERY  2021   Social History   Socioeconomic History   Marital status: Married    Spouse name: Not on file   Number of  children: 4   Years of education: Not on file   Highest education level: GED or equivalent  Occupational History   Not on file  Tobacco Use   Smoking status: Every Day    Current packs/day: 1.00    Average packs/day: 0.9 packs/day for 25.0 years (22.8 ttl pk-yrs)    Types: Cigarettes, E-cigarettes   Smokeless tobacco: Never   Tobacco comments:    09/25/21 4 a day  Vaping Use   Vaping status: Never Used  Substance and Sexual Activity   Alcohol use: No   Drug use: Yes    Types: Marijuana   Sexual activity: Yes    Birth control/protection: None    Comment: Wife had Tubial ligation  Other Topics Concern   Not on file  Social History Narrative   Married, has 4 children. Works as a Product manager at [employer]. Drinks 4 oz of coffee a day.    Social Drivers of Health  Financial Resource Strain: Medium Risk (12/09/2023)   Overall Financial Resource Strain (CARDIA)    Difficulty of Paying Living Expenses: Somewhat hard  Food Insecurity: Food Insecurity Present (12/09/2023)   Hunger Vital Sign    Worried About Running Out of Food in the Last Year: Sometimes true    Ran Out of Food in the Last Year: Sometimes true  Transportation Needs: No Transportation Needs (12/09/2023)   PRAPARE - Administrator, Civil Service (Medical): No    Lack of Transportation (Non-Medical): No  Physical Activity: Sufficiently Active (12/09/2023)   Exercise Vital Sign    Days of Exercise per Week: 5 days    Minutes of Exercise per Session: 60 min  Stress: Stress Concern Present (12/09/2023)   [employer] of Occupational Health - Occupational Stress Questionnaire    Feeling of Stress : Rather much  Social Connections: Moderately Isolated (12/09/2023)   Social Connection and Isolation Panel    Frequency of Communication with Friends and Family: More than three times a week    Frequency of Social Gatherings with Friends and Family: Once a week    Attends Religious  Services: Never    Database administrator or Organizations: No    Attends Engineer, structural: Not on file    Marital Status: Married  Intimate Partner Violence: Unknown (03/30/2022)   Received from [facility]   HITS    Physically Hurt: Not on file    Insult or Talk Down To: Not on file    Threaten Physical Harm: Not on file    Scream or Curse: Not on file      12/09/2023    9:49 AM  Alcohol Use Disorder Test (AUDIT)  1. How often do you have a drink containing alcohol? 0  3. How often do you have six or more drinks on one occasion? 0   Family History  Problem Relation Age of Onset   Hypertension Mother    Healthy Father    Cancer Maternal Aunt    Diabetes Maternal Grandmother    Thyroid disease Maternal Grandfather    Cancer Maternal Grandfather   No Known Allergies Social History   Substance and Sexual Activity  Sexual Activity Yes   Birth control/protection: None   Comment: Wife had Tubial ligation  @    12/13/2023    1:27 PM  Depression screen PHQ 2/9  Decreased Interest 0  Down, Depressed, Hopeless 0  PHQ - 2 Score 0  Altered sleeping 0  Tired, decreased energy 1  Change in appetite 0  Feeling bad or failure about yourself  0  Trouble concentrating 0  Moving slowly or fidgety/restless 0  Suicidal thoughts 0  PHQ-9 Score 1  Difficult doing work/chores Not difficult at all      02/14/2024   10:20 AM  Fall Risk   Falls in the past year? 0     BP 132/70 (BP Location: Left Arm, Patient Position: Sitting, Cuff Size: Large)   Pulse 80   Temp 98.1 F (36.7 C) (Temporal)   Ht 5' 8 (1.727 m)   Wt 179 lb (81.2 kg)   SpO2 95%   BMI 27.22 kg/m  BP Readings from Last 3 Encounters:  10/02/24 132/70  02/14/24 132/88  01/22/24 (!) 145/83   Wt Readings from Last 10 Encounters:  10/02/24 179 lb (81.2 kg)  02/14/24 184 lb 3.2 oz (83.6 kg)  01/22/24 178 lb (80.7 kg)  01/01/24 182 lb 9.6 oz (82.8 kg)  12/13/23 178 lb 9.6 oz (81 kg)  10/31/23 179  lb 12.8 oz (81.6 kg)  10/02/23 178 lb 6.4 oz (80.9 kg)  09/18/23 179 lb 6.4 oz (81.4 kg)  06/02/23 170 lb (77.1 kg)  01/16/23 173 lb (78.5 kg)  Physical Exam  Physical Exam VITALS: BP- 132/70 HEENT: Vision 20/20.   GEN: No acute distress, resting comfortably. HEENT: Tympanic membranes normal appearing bilaterally, oropharynx clear, no thyromegaly noted, no palpable lymphadenopathy or thyroid nodules. CARDIOVASCULAR: S1 and S2 heart sounds with regular rate and rhythm, no murmurs appreciated. PULMONARY: Normal work of breathing, clear to auscultation bilaterally, no crackles, wheezes, or rhonchi. ABDOMEN: Soft, nontender, nondistended. MSK: No edema, cyanosis, or clubbing noted. SKIN: Warm, dry, no lesions of concern observed. NEUROLOGICAL: Cranial nerves II-XII grossly intact, strength 5/5 in upper and lower extremities, reflexes symmetric and intact bilaterally. PSYCH: Normal affect and thought content, pleasant and cooperative.  Last CBC Lab Results  Component Value Date   WBC 10.2 10/02/2024   HGB 14.4 10/02/2024   HCT 43.5 10/02/2024   MCV 92.1 10/02/2024   MCH 31.5 10/07/2020   RDW 13.8 10/02/2024   PLT 334.0 10/02/2024   Last metabolic panel Lab Results  Component Value Date   GLUCOSE 103 (H) 10/02/2024   NA 139 10/02/2024   K 3.9 10/02/2024   CL 104 10/02/2024   CO2 27 10/02/2024   BUN 8 10/02/2024   CREATININE 0.95 10/02/2024   GFR 97.89 10/02/2024   CALCIUM  8.9 10/02/2024   PROT 6.3 10/02/2024   ALBUMIN 4.1 10/02/2024   LABGLOB 2.5 10/07/2020   AGRATIO 1.9 10/07/2020   BILITOT 0.4 10/02/2024   ALKPHOS 62 10/02/2024   AST 19 10/02/2024   ALT 14 10/02/2024   ANIONGAP 11 02/26/2020   Last lipids Lab Results  Component Value Date   CHOL 184 10/02/2024   HDL 27.40 (L) 10/02/2024   LDLCALC 128 (H) 10/02/2024   TRIG 140.0 10/02/2024   CHOLHDL 7 10/02/2024   Last hemoglobin A1c Lab Results  Component Value Date   HGBA1C 5.7 (H) 10/07/2020   Last  thyroid functions Lab Results  Component Value Date   TSH 0.88 09/18/2023   Last vitamin D No results found for: 25OHVITD2, 25OHVITD3, VD25OH Last vitamin B12 and Folate Lab Results  Component Value Date   VITAMINB12 622 10/07/2020        ======================================  IMPORTANT HEALTH REMINDERS: Report any new or changing skin lesions promptly Maintain recommended screening schedules Discuss any new family history of cancer at future visits Follow up on any new symptoms that persist more than two weeks      Notes:  This document was synthesized by artificial intelligence (Abridge) using HIPAA-compliant recording of the clinical interaction;   We discussed the use of AI scribe software for clinical note transcription with the patient, who gave verbal consent to proceed.    This encounter employed state-of-the-art, real-time, collaborative documentation. The patient was empowered to actively review and assist in updating their electronic medical record on a shared monitor, ensuring transparency and improving accuracy.    Prior to and at the beginning of Comprehensive Physical Exam (CPE) preventive care annual visit appointment types  we clarify to patients Our goal today is to focus on your preventive or annual Comprehensive Physical Exam (CPE) preventive care annual visit, which typically covers routine screenings and overall health maintenance. However, if you share any new or concerning symptoms--such as dizziness, passing out, severe pain, or anything else that may point to  a more serious issue--we are both legally and ethically required to evaluate it. We cannot simply overlook or ignore such concerns, even if you later decide you don't want to discuss them, because it could jeopardize your health.  If addressing a new concern takes us  beyond the scope of the preventive visit, we may need to bill separately for that portion of care. We understand financial  considerations are important, and we're happy to discuss your options if something new comes up. However, we want to be clear that once you mention a potentially serious issue, we must investigate it; we can't ethically or legally exclude that from our records or our evaluation. Please let us  know all of your questions or worries. Together, we can decide how best to manage them and how to minimize any unexpected costs, but we want to keep you safe above all else.   This disclosure is mandated by professional ethics and legal obligations, as healthcare providers must address any substantial health concerns raised during any patient interaction and a comprehensive ROS is required by insurance companies for billing preventive-care visit type.   This disclosure ultimately discourages patients financially from reporting significant health issues.   Medical Screening Exam A medical screening exam (MSE) helps to determine whether you need immediate medical treatment relating to any number of symptoms you are having. This type of exam may be done in an emergency department, an urgent care setting, or your health care provider's office. Depending on your symptoms and severity, you may need additional tests or medical therapy. It is important to note that an MSE does not necessarily mean that you will need or receive further medical testing or interventions if your symptoms are not deemed to be medically urgent (emergent). Tell a health care provider about: Any allergies you have. All medicines you are taking, including vitamins, herbs, eye drops, creams, and over-the-counter medicines. Any problems you or family members have had with anesthetic medicines. Any bleeding problems you have. Any surgeries you have had. Any medical conditions you have. Whether you are pregnant or may be pregnant. What happens during the test? During the exam, a health care provider does a short, often focused, physical exam and  asks about your medical history to assess: Your current symptoms. Your overall health. Your need for possible further medical intervention. What can I expect after the test? If you have a regular health care provider, make an appointment for a follow-up visit with him or her. If you do not have a regular health care provider, ask about resources in your community. Your medical screening exam may determine that: You do not need emergency treatment at this time. You need treatment right away. You need to be transferred to another medical center. This may happen if you need an emergent specialist or consultant that is not available at the medical center you are at. You need to have more tests. A medical specialist may be consulted if needed. Get help right away if: Your condition gets worse. You develop new or troubling symptoms before you see your health care provider. These symptoms may represent a serious problem that is an emergency. Do not wait to see if the symptoms will go away. Get medical help right away. Call your local emergency services (911 in the [place] Do not drive yourself to the hospital. Summary A medical screening exam helps to determine whether you need medical treatment right away. This type of exam may be done in an emergency department, an urgent  care setting, or your health care provider's office. During the exam, a health care provider does a short physical exam and asks about your current symptoms and overall health. Depending on the exam, more tests or therapies may be ordered. However, an MSE does not necessarily mean that you will have further medical testing if your symptoms are not deemed to be urgent. If you need further care that is not offered at your current medical center, you may need to be transferred to another facility. This information is not intended to replace advice given to you by your health care provider. Make sure you discuss any questions you have  with your health care provider. Document Revised: 08/23/2021 Document Reviewed: 04/20/2021 Elsevier Patient Education  2024 Elsevier Inc.   Health Maintenance, Male Adopting a healthy lifestyle and getting preventive care are important in promoting health and wellness. Ask your health care provider about: The right schedule for you to have regular tests and exams. Things you can do on your own to prevent diseases and keep yourself healthy. What should I know about diet, weight, and exercise? Eat a healthy diet  Eat a diet that includes plenty of vegetables, fruits, low-fat dairy products, and lean protein. Do not eat a lot of foods that are high in solid fats, added sugars, or sodium. Maintain a healthy weight Body mass index (BMI) is a measurement that can be used to identify possible weight problems. It estimates body fat based on height and weight. Your health care provider can help determine your BMI and help you achieve or maintain a healthy weight. Get regular exercise Get regular exercise. This is one of the most important things you can do for your health. Most adults should: Exercise for at least 150 minutes each week. The exercise should increase your heart rate and make you sweat (moderate-intensity exercise). Do strengthening exercises at least twice a week. This is in addition to the moderate-intensity exercise. Spend less time sitting. Even light physical activity can be beneficial. Watch cholesterol and blood lipids Have your blood tested for lipids and cholesterol at 43 years of age, then have this test every 5 years. You may need to have your cholesterol levels checked more often if: Your lipid or cholesterol levels are high. You are older than 43 years of age. You are at high risk for heart disease. What should I know about cancer screening? Many types of cancers can be detected early and may often be prevented. Depending on your health history and family history, you  may need to have cancer screening at various ages. This may include screening for: Colorectal cancer. Prostate cancer. Skin cancer. Lung cancer. What should I know about heart disease, diabetes, and high blood pressure? Blood pressure and heart disease High blood pressure causes heart disease and increases the risk of stroke. This is more likely to develop in people who have high blood pressure readings or are overweight. Talk with your health care provider about your target blood pressure readings. Have your blood pressure checked: Every 3-5 years if you are 51-108 years of age. Every year if you are 91 years old or older. If you are between the ages of 70 and 7 and are a current or former smoker, ask your health care provider if you should have a one-time screening for abdominal aortic aneurysm (AAA). Diabetes Have regular diabetes screenings. This checks your fasting blood sugar level. Have the screening done: Once every three years after age 24 if you are at  a normal weight and have a low risk for diabetes. More often and at a younger age if you are overweight or have a high risk for diabetes. What should I know about preventing infection? Hepatitis B If you have a higher risk for hepatitis B, you should be screened for this virus. Talk with your health care provider to find out if you are at risk for hepatitis B infection. Hepatitis C Blood testing is recommended for: Everyone born from [birth year] through 1965. Anyone with known risk factors for hepatitis C. Sexually transmitted infections (STIs) You should be screened each year for STIs, including gonorrhea and chlamydia, if: You are sexually active and are younger than 43 years of age. You are older than 43 years of age and your health care provider tells you that you are at risk for this type of infection. Your sexual activity has changed since you were last screened, and you are at increased risk for chlamydia or gonorrhea. Ask your  health care provider if you are at risk. Ask your health care provider about whether you are at high risk for HIV. Your health care provider may recommend a prescription medicine to help prevent HIV infection. If you choose to take medicine to prevent HIV, you should first get tested for HIV. You should then be tested every 3 months for as long as you are taking the medicine. Follow these instructions at home: Alcohol use Do not drink alcohol if your health care provider tells you not to drink. If you drink alcohol: Limit how much you have to 0-2 drinks a day. Know how much alcohol is in your drink. In the [place] one drink equals one 12 oz bottle of beer (355 mL), one 5 oz glass of wine (148 mL), or one 1 oz glass of hard liquor (44 mL). Lifestyle Do not use any products that contain nicotine  or tobacco. These products include cigarettes, chewing tobacco, and vaping devices, such as e-cigarettes. If you need help quitting, ask your health care provider. Do not use street drugs. Do not share needles. Ask your health care provider for help if you need support or information about quitting drugs. General instructions Schedule regular health, dental, and eye exams. Stay current with your vaccines. Tell your health care provider if: You often feel depressed. You have ever been abused or do not feel safe at home. Summary Adopting a healthy lifestyle and getting preventive care are important in promoting health and wellness. Follow your health care provider's instructions about healthy diet, exercising, and getting tested or screened for diseases. Follow your health care provider's instructions on monitoring your cholesterol and blood pressure. This information is not intended to replace advice given to you by your health care provider. Make sure you discuss any questions you have with your health care provider. Document Revised: 05/01/2021 Document Reviewed: 05/01/2021 Elsevier Patient Education   2024 [employer].

## 2024-10-03 ENCOUNTER — Ambulatory Visit: Payer: Self-pay | Admitting: Internal Medicine

## 2024-10-03 DIAGNOSIS — I2510 Atherosclerotic heart disease of native coronary artery without angina pectoris: Secondary | ICD-10-CM

## 2024-10-03 DIAGNOSIS — E785 Hyperlipidemia, unspecified: Secondary | ICD-10-CM

## 2024-10-03 NOTE — Progress Notes (Signed)
 Reviewed, all essentially normal except cholesterol.  Message shared to patient.

## 2024-10-07 ENCOUNTER — Ambulatory Visit (HOSPITAL_COMMUNITY)
Admission: RE | Admit: 2024-10-07 | Discharge: 2024-10-07 | Disposition: A | Discharge status: Home / Self Care | Source: Ambulatory Visit | Attending: Acute Care | Admitting: Acute Care

## 2024-10-07 DIAGNOSIS — R918 Other nonspecific abnormal finding of lung field: Secondary | ICD-10-CM | POA: Insufficient documentation

## 2024-10-11 MED ORDER — REPATHA SURECLICK 140 MG/ML ~~LOC~~ SOAJ
140.0000 mg | SUBCUTANEOUS | 2 refills | Status: AC
  Filled 2024-10-11: qty 2, 28d supply, fill #0

## 2024-10-11 MED ORDER — NEXLETOL 180 MG PO TABS
1.0000 | ORAL_TABLET | Freq: Every day | ORAL | 4 refills | Status: AC
  Filled 2024-10-11: qty 90, 90d supply, fill #0

## 2024-10-12 ENCOUNTER — Other Ambulatory Visit (HOSPITAL_BASED_OUTPATIENT_CLINIC_OR_DEPARTMENT_OTHER): Payer: Self-pay

## 2024-10-14 ENCOUNTER — Encounter: Payer: Self-pay | Admitting: Acute Care

## 2024-10-14 ENCOUNTER — Other Ambulatory Visit (HOSPITAL_COMMUNITY): Payer: Self-pay

## 2024-10-14 ENCOUNTER — Other Ambulatory Visit (HOSPITAL_BASED_OUTPATIENT_CLINIC_OR_DEPARTMENT_OTHER): Payer: Self-pay

## 2024-10-14 ENCOUNTER — Ambulatory Visit: Admitting: Acute Care

## 2024-10-14 VITALS — BP 138/86 | HR 69 | Temp 97.9°F | Ht 68.0 in | Wt 176.2 lb

## 2024-10-14 DIAGNOSIS — F17200 Nicotine dependence, unspecified, uncomplicated: Secondary | ICD-10-CM

## 2024-10-14 DIAGNOSIS — R911 Solitary pulmonary nodule: Secondary | ICD-10-CM | POA: Diagnosis not present

## 2024-10-14 DIAGNOSIS — R9389 Abnormal findings on diagnostic imaging of other specified body structures: Secondary | ICD-10-CM | POA: Diagnosis not present

## 2024-10-14 DIAGNOSIS — J439 Emphysema, unspecified: Secondary | ICD-10-CM | POA: Diagnosis not present

## 2024-10-14 NOTE — Progress Notes (Signed)
 History of Present Illness Gabriel Petty is a 43 y.o. male current every day smoker with Shortness of breath; History of COVID-19 2021; Chest wall pain; Dyslipidemia; Hypertension; Smoker; Coronary artery disease; COPD (chronic obstructive pulmonary disease) (HCC); Emphysema lung (HCC); and Lung nodule seen on imaging study on their problem list. He has been referred to pulmonary 01/2024 for evaluation . He will be followed by Dr. Shelah.    10/14/2024 Discussed the use of AI scribe software for clinical note transcription with the patient, who gave verbal consent to proceed.  Synopsis Referred by Dr. Jesus 01/2024 for abnormal lung cancer screening scan 09/2023. The nodule measures 3.9 millimeters and is located in the left upper lobe. There had been no follow-up imaging since its discovery. He has a significant smoking history, having smoked Newports for almost thirty years, but recently switched to using a nicotine  vape a few days ago. He uses the vape three times a day and denies using smokeless tobacco.   His past medical history includes hypertension, emphysema, elevated cholesterol, and chronic headaches. He underwent neck and back surgery in November 2021. He is married, lives with his wife and son, and works as an Product/process development scientist. He has no recent travel history. Family history od grandfather with small cell lung cancer and prostate cancer. Maternal side of family have a breast cancer history. He has an aunt who died recently of brain cancer.    History of Present Illness Gabriel Petty is a 43 year old male with emphysema and pulmonary nodules who presents for follow-up CT Chest for pulmonary nodules.  He is being monitored for multiple small pulmonary nodules, with the primary nodule measuring 3.9 millimeters, showing a slight increase from 3.8 millimeters over the past year. Two other nodules measure 2.6 millimeters and 2.1 millimeters, both stable.   We have reviewed his CT Chest together.  The pulmonary nodules of concern are stable. Plan will be for an annual follow up due 09/2025. When he is 50 he can be enrolled in the Lung Cancer Screening Program.   Pt.  has emphysema. He continues to smoke. A pulmonary function test in February indicated mild obstructive airways disease with normal diffusion capacity. 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 He does not currently use an albuterol  inhaler but is open to trying it for symptomatic relief.  He does not take regular medications but occasionally uses three Bayer aspirin . He has previously been prescribed an albuterol  inhaler, which he did not pick up, and he has two refills available.He is going to  pick up one of the albuterol  inhalers to see if it provides any benefit to his dyspnea.    Test Results: CT Chest 10/07/2024>> Personally reviewed by me Lungs/Pleura: Mild, predominantly paraseptal emphysema. Diffuse bilateral bronchial wall thickening. Unchanged small pulmonary nodules, largest in the anterior left upper lobe measuring 0.4 cm (series 7, image 49). No pleural effusion or pneumothorax.   Unchanged small pulmonary nodules, largest in the anterior left upper lobe measuring 0.4 cm. These are benign and requiring no specific further follow-up or characterization. Consider ongoing annual low-dose CT lung cancer screening if indicated by patient age, smoking history, or other risk factors for lung cancer. 2. Emphysema and diffuse bilateral bronchial wall thickening. 3. Coronary artery disease.  PFT 01/2024>> Personally reviewed by me  10/15/2023 LDCT Chest Lung-RADS 2S, benign appearance or behavior. Continue annual screening with low-dose chest CT without contrast in 12 months. S modifier for coronary artery calcifications. 2. Moderate coronary artery calcifications, recommend  ASCVD risk assessment. 3. Emphysema (ICD10-J43.9)    Latest Ref Rng & Units 10/02/2024    1:21 PM 09/18/2023    4:16 PM 10/07/2020    9:58 AM  CBC  WBC 4.0 - 10.5 K/uL 10.2  7.9  8.3   Hemoglobin 13.0 - 17.0 g/dL 85.5  84.4  82.9   Hematocrit 39.0 - 52.0 % 43.5  46.2  49.0   Platelets 150.0 - 400.0 K/uL 334.0  306.0  307        Latest Ref Rng & Units 10/02/2024    1:21 PM 09/18/2023    4:16 PM 10/07/2020    9:58 AM  BMP  Glucose 70 - 99 mg/dL 896  98  97   BUN 6 - 23 mg/dL 8  5  11    Creatinine 0.40 - 1.50 mg/dL 9.04  8.90  8.88   BUN/Creat Ratio 9 - 20   10   Sodium 135 - 145 mEq/L 139  139  140   Potassium 3.5 - 5.1 mEq/L 3.9  4.0  4.4   Chloride 96 - 112 mEq/L 104  104  101   CO2 19 - 32 mEq/L 27  27  23    Calcium  8.4 - 10.5 mg/dL 8.9  9.2  9.7     BNP No results found for: BNP  ProBNP No results found for: PROBNP  PFT    Component Value Date/Time   FEV1PRE 2.82 04/06/2024 1022   FEV1POST 0.84 04/06/2024 1022   FVCPRE 3.60 04/06/2024 1022   FVCPOST 3.48 04/06/2024 1022   TLC 9.69 04/06/2024 1022   DLCOUNC 24.46 04/06/2024 1022   PREFEV1FVCRT 78 04/06/2024 1022   PSTFEV1FVCRT 24 04/06/2024 1022    CT CHEST WO CONTRAST Result Date: 10/12/2024 CLINICAL DATA:  Lung nodule, high cancer risk * Tracking Code: BO * EXAM: CT CHEST WITHOUT CONTRAST TECHNIQUE: Multidetector CT imaging of the chest was performed following the standard protocol without IV contrast. RADIATION DOSE REDUCTION: This exam was performed according to the departmental dose-optimization program which includes automated exposure control, adjustment of the mA and/or kV according to patient size and/or use of iterative reconstruction technique. COMPARISON:  10/15/2023 FINDINGS: Cardiovascular: No significant vascular findings. Normal heart size. Three-vessel coronary artery calcifications. No pericardial effusion. Mediastinum/Nodes: No enlarged mediastinal, hilar, or axillary lymph nodes. 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 Thyroid gland, trachea, and esophagus demonstrate no significant findings. Lungs/Pleura: Mild, predominantly paraseptal emphysema. Diffuse bilateral bronchial wall thickening. Unchanged small pulmonary nodules, largest in the anterior left upper lobe measuring 0.4 cm (series 7, image 49). No pleural effusion or pneumothorax. Upper Abdomen: No acute abnormality. Musculoskeletal: Bilateral gynecomastia.  No acute osseous findings. IMPRESSION: 1. Unchanged small pulmonary nodules, largest in the anterior left upper lobe measuring 0.4 cm. These are benign and requiring no specific further follow-up or characterization. Consider ongoing annual low-dose CT lung cancer screening if indicated by patient age, smoking history, or other risk factors for lung cancer. 2. Emphysema and diffuse bilateral bronchial wall thickening. 3. Coronary artery disease. Emphysema (ICD10-J43.9). Electronically Signed   By: Marolyn JONETTA Jaksch M.D.   On: 10/12/2024 21:02     Past medical hx Past Medical History:  Diagnosis Date   Headaches, cluster    Hypertension    Smoker  Social History   Tobacco Use   Smoking status: Every Day    Current packs/day: 1.00    Average packs/day: 0.9 packs/day for 25.0 years (22.8 ttl pk-yrs)    Types: Cigarettes, E-cigarettes   Smokeless tobacco: Never   Tobacco comments:    12 cigarettes a day 10/14/2024 KRD  Vaping Use   Vaping status: Never Used  Substance Use Topics   Alcohol use: No   Drug use: Yes    Types: Marijuana    Mr.Cosey reports that he has been smoking cigarettes and e-cigarettes. He has a 22.8 pack-year smoking history. He has never used smokeless tobacco. He reports current drug use. Drug: Marijuana. He reports that he does not drink alcohol.  Tobacco Cessation: Ready to quit: Not Answered Counseling given: Not Answered Tobacco comments: 12 cigarettes a day 10/14/2024 KRD Current every day smoker  Counseled to quit  Past  surgical hx, Family hx, Social hx all reviewed.  Current Outpatient Medications on File Prior to Visit  Medication Sig   albuterol  (VENTOLIN  HFA) 108 (90 Base) MCG/ACT inhaler Inhale 1-2 puffs into the lungs every 6 (six) hours as needed for wheezing or shortness of breath.   aspirin  EC 81 MG tablet Take 1 tablet (81 mg total) by mouth daily. Swallow whole.   Atogepant  (QULIPTA ) 60 MG TABS Take 1 tablet (60 mg total) by mouth daily. (Patient not taking: Reported on 10/14/2024)   Bempedoic Acid (NEXLETOL) 180 MG TABS Take 1 tablet (180 mg total) by mouth daily at 6 (six) AM. Low Density Lipoprotein (LDL cholesterol) 120 despite statin. History coronary artery disease. (Patient not taking: Reported on 10/14/2024)   Evolocumab (REPATHA SURECLICK) 140 MG/ML SOAJ Inject 140 mg into the skin every 14 (fourteen) days. (Patient not taking: Reported on 10/14/2024)   losartan  (COZAAR ) 25 MG tablet Take 1 tablet (25 mg total) by mouth daily. (Patient not taking: Reported on 10/14/2024)   nicotine  polacrilex (NICORETTE ) 4 MG gum On weeks 10-12: Use 1 piece by mouth as directed every 4-8 hours. Max 24 pieces per day. (Patient not taking: Reported on 10/14/2024)   Rimegepant Sulfate  (NURTEC) 75 MG TBDP Take 1 tablet by mouth at the onset of migraine. Only 1 tablet in 24 hours. (Patient not taking: Reported on 10/14/2024)   rosuvastatin  (CRESTOR ) 40 MG tablet Take 1 tablet (40 mg total) by mouth daily. (Patient not taking: Reported on 10/14/2024)   [DISCONTINUED] cetirizine (ZYRTEC) 10 MG tablet Take 10 mg by mouth daily.   [DISCONTINUED] diphenhydramine-acetaminophen  (TYLENOL  PM) 25-500 MG TABS tablet Take 1 tablet by mouth at bedtime as needed.   No current facility-administered medications on file prior to visit.     No Known Allergies  Review Of Systems:  Constitutional:   No  weight loss, night sweats,  Fevers, chills, fatigue, or  lassitude.  HEENT:   No headaches,  Difficulty swallowing,   Tooth/dental problems, or  Sore throat,                No sneezing, itching, ear ache, nasal congestion, post nasal drip,   CV:  No chest pain,  Orthopnea, PND, swelling in lower extremities, anasarca, dizziness, palpitations, syncope.   GI  No heartburn, indigestion, abdominal pain, nausea, vomiting, diarrhea, change in bowel habits, loss of appetite, bloody stools.   Resp: No shortness of breath with exertion or at rest.  No excess mucus, no productive cough,  No non-productive cough,  No coughing up of blood.  No change in color of  mucus.  No wheezing.  No chest wall deformity  Skin: no rash or lesions.  GU: no dysuria, change in color of urine, no urgency or frequency.  No flank pain, no hematuria   MS:  No joint pain or swelling.  No decreased range of motion.  No back pain.  Psych:  No change in mood or affect. No depression or anxiety.  No memory loss.   Vital Signs BP 138/86   Pulse 69   Temp 97.9 F (36.6 C) (Oral)   Ht 5' 8 (1.727 m)   Wt 176 lb 3.2 oz (79.9 kg)   SpO2 99%   BMI 26.79 kg/m    Physical Exam:  General- No distress,  A&Ox3, pleasant ENT: No sinus tenderness, TM clear, pale nasal mucosa, no oral exudate,no post nasal drip, no LAN Cardiac: S1, S2, regular rate and rhythm, no murmur Chest: No wheeze/ rales/ dullness; no accessory muscle use, no nasal flaring, no sternal retractions, clear throughout Abd.: Soft Non-tender, ND, BS +, Body mass index is 26.79 kg/m.  Ext: No clubbing cyanosis, edema, no obvious deformities Neuro:  normal strength, MAE x 4, A&O x 3 , appropriate Skin: No rashes, warm and dry, no obvious skin lesions  Psych: normal mood and behavior  Assessment & Plan Emphysema with mild obstructive airways disease Emphysema with alveolar scarring and thickening, mild obstructive airways disease, normal diffusion capacity.  Occasional exertional dyspnea. - Prescribed albuterol  inhaler as rescue. - Instructed to use albuterol  as needed  for dyspnea or wheezing, starting with one puff, increasing to two if necessary.  Pulmonary nodules under surveillance Pulmonary nodules stable, largest 3.9 mm, considered benign. - 3 month follow up CT Chest without contrast due October 2026.  Current every day smoker  Plan - Please work on quitting smoking - You can receive free nicotine  replacement therapy (patches, gum, or mints) by calling 1-800-QUIT NOW. Please call so we can get you on the path to becoming a non-smoker. I know it is hard, but you can do this!  Hypnosis for smoking cessation  [employer] [phone]  Acupuncture for smoking cessation  [employer] [phone]    I spent 25 minutes dedicated to the care of this patient on the date of this encounter to include pre-visit review of records, face-to-face time with the patient discussing conditions above, post visit ordering of testing, clinical documentation with the electronic health record, making appropriate referrals as documented, and communicating necessary information to the patient's healthcare team.      Lauraine JULIANNA Lites, NP 10/14/2024  2:14 PM

## 2024-10-14 NOTE — Patient Instructions (Signed)
 It is good to see you today. I am glad you are doing well from a respiratory standpoint. We have reviewed your CT chest which shows that the pulmonary nodule in the left upper lobe that we have been following is stable in size. We will do an annual CT scan due October 2026. We will get you enrolled in the pulmonary lung cancer screening program, you will get a phone call closer to the time the scan is due to get it scheduled. Please fill the albuterol  inhaler that was ordered in the past. Use this as needed for shortness of breath or wheezing. 1 to 2 puffs as needed every 6 hours not to exceed 3 doses per day. If you need it more than 3 times daily please call to be seen. If you experience any unexplained weight loss or if you have blood in your sputum when you cough please call to be seen sooner. Please contact office for sooner follow up if symptoms do not improve or worsen or seek emergency care

## 2024-10-15 ENCOUNTER — Other Ambulatory Visit (HOSPITAL_BASED_OUTPATIENT_CLINIC_OR_DEPARTMENT_OTHER): Payer: Self-pay

## 2024-10-15 ENCOUNTER — Telehealth: Payer: Self-pay

## 2024-10-15 ENCOUNTER — Other Ambulatory Visit (HOSPITAL_COMMUNITY): Payer: Self-pay

## 2024-10-15 NOTE — Telephone Encounter (Signed)
 Pharmacy Patient Advocate Encounter   Received notification from Onbase that prior authorization for Nexletol 180MG  tablets  is required/requested.   Insurance verification completed.   The patient is insured through [facility].   Per test claim: PA required; PA submitted to above mentioned insurance via Latent Key/confirmation #/EOC [id number] Status is pending

## 2024-10-15 NOTE — Telephone Encounter (Signed)
 Pharmacy Patient Advocate Encounter  Received notification from [employer] that Prior Authorization for Nexletol 180MG  tablets  has been APPROVED from 10/15/24 to 12/23/2038. Ran test claim, Copay is $25. This test claim was processed through [facility]- copay amounts may vary at other pharmacies due to pharmacy/plan contracts, or as the patient moves through the different stages of their insurance plan.   PA #/Case ID/Reference #: [id number]

## 2024-10-16 ENCOUNTER — Other Ambulatory Visit (HOSPITAL_BASED_OUTPATIENT_CLINIC_OR_DEPARTMENT_OTHER): Payer: Self-pay

## 2024-10-19 ENCOUNTER — Telehealth: Payer: Self-pay | Admitting: Pharmacist

## 2024-10-19 ENCOUNTER — Other Ambulatory Visit (HOSPITAL_BASED_OUTPATIENT_CLINIC_OR_DEPARTMENT_OTHER): Payer: Self-pay

## 2024-10-19 NOTE — Telephone Encounter (Signed)
 Pharmacy Patient Advocate Encounter   Received notification from Patient Pharmacy that prior authorization for Nurtec 75MG  dispersible tablets is required/requested.   Insurance verification completed.   The patient is insured through [facility].   Per test claim: PA required; PA submitted to above mentioned insurance via Latent Key/confirmation #/EOC [id number] Status is pending

## 2024-10-19 NOTE — Telephone Encounter (Signed)
 Pharmacy Patient Advocate Encounter  Received notification from [employer] that Prior Authorization for NURTEC 75 MG PO TBDP has been APPROVED from 10/19/2024 to 10/19/2026   PA #/Case ID/Reference #: [id number]

## 2024-10-21 ENCOUNTER — Other Ambulatory Visit (HOSPITAL_BASED_OUTPATIENT_CLINIC_OR_DEPARTMENT_OTHER): Payer: Self-pay

## 2024-11-10 NOTE — Telephone Encounter (Signed)
 Please advise

## 2024-11-11 ENCOUNTER — Other Ambulatory Visit (HOSPITAL_BASED_OUTPATIENT_CLINIC_OR_DEPARTMENT_OTHER): Payer: Self-pay

## 2024-11-11 ENCOUNTER — Other Ambulatory Visit: Payer: Self-pay | Admitting: Acute Care

## 2024-11-11 DIAGNOSIS — J441 Chronic obstructive pulmonary disease with (acute) exacerbation: Secondary | ICD-10-CM

## 2024-11-11 MED ORDER — PREDNISONE 10 MG PO TABS
ORAL_TABLET | ORAL | 0 refills | Status: AC
  Filled 2024-11-11: qty 20, 8d supply, fill #0

## 2024-11-11 MED ORDER — DOXYCYCLINE HYCLATE 100 MG PO TABS
100.0000 mg | ORAL_TABLET | Freq: Two times a day (BID) | ORAL | 0 refills | Status: AC
  Filled 2024-11-11: qty 14, 7d supply, fill #0

## 2024-11-13 ENCOUNTER — Other Ambulatory Visit (HOSPITAL_BASED_OUTPATIENT_CLINIC_OR_DEPARTMENT_OTHER): Payer: Self-pay

## 2024-11-16 ENCOUNTER — Ambulatory Visit: Admitting: Acute Care

## 2024-12-04 ENCOUNTER — Encounter: Payer: Self-pay | Admitting: Cardiovascular Disease

## 2025-10-04 ENCOUNTER — Encounter: Admitting: Internal Medicine
